# Patient Record
Sex: Female | Born: 1995 | Race: Black or African American | Hispanic: No | Marital: Married | Smoking: Current some day smoker
Health system: Southern US, Community
[De-identification: ages and names within clinical notes are randomized; demographics above are authoritative.]

## PROBLEM LIST (undated history)

## (undated) DIAGNOSIS — Z789 Other specified health status: Secondary | ICD-10-CM

## (undated) DIAGNOSIS — F419 Anxiety disorder, unspecified: Secondary | ICD-10-CM

## (undated) DIAGNOSIS — F32A Depression, unspecified: Secondary | ICD-10-CM

## (undated) DIAGNOSIS — F4310 Post-traumatic stress disorder, unspecified: Secondary | ICD-10-CM

## (undated) HISTORY — DX: Post-traumatic stress disorder, unspecified: F43.10

## (undated) HISTORY — DX: Anxiety disorder, unspecified: F41.9

## (undated) HISTORY — PX: NO PAST SURGERIES: SHX2092

## (undated) HISTORY — DX: Depression, unspecified: F32.A

---
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
## 2013-02-19 ENCOUNTER — Encounter (HOSPITAL_COMMUNITY): Payer: Self-pay | Admitting: Emergency Medicine

## 2013-02-19 ENCOUNTER — Emergency Department (HOSPITAL_COMMUNITY)
Admission: EM | Admit: 2013-02-19 | Discharge: 2013-02-19 | Disposition: A | Discharge status: Home / Self Care | Payer: Medicaid Other | Attending: Emergency Medicine | Admitting: Emergency Medicine

## 2013-02-19 DIAGNOSIS — N949 Unspecified condition associated with female genital organs and menstrual cycle: Secondary | ICD-10-CM | POA: Insufficient documentation

## 2013-02-19 DIAGNOSIS — N925 Other specified irregular menstruation: Secondary | ICD-10-CM | POA: Insufficient documentation

## 2013-02-19 DIAGNOSIS — F17200 Nicotine dependence, unspecified, uncomplicated: Secondary | ICD-10-CM | POA: Insufficient documentation

## 2013-02-19 DIAGNOSIS — N939 Abnormal uterine and vaginal bleeding, unspecified: Secondary | ICD-10-CM

## 2013-02-19 DIAGNOSIS — Z3202 Encounter for pregnancy test, result negative: Secondary | ICD-10-CM | POA: Insufficient documentation

## 2013-02-19 DIAGNOSIS — N938 Other specified abnormal uterine and vaginal bleeding: Secondary | ICD-10-CM | POA: Insufficient documentation

## 2013-02-19 LAB — URINE MICROSCOPIC-ADD ON

## 2013-02-19 LAB — WET PREP, GENITAL
CLUE CELLS WET PREP: NONE SEEN
Trich, Wet Prep: NONE SEEN
WBC, Wet Prep HPF POC: NONE SEEN
YEAST WET PREP: NONE SEEN

## 2013-02-19 LAB — URINALYSIS, ROUTINE W REFLEX MICROSCOPIC
BILIRUBIN URINE: NEGATIVE
GLUCOSE, UA: NEGATIVE mg/dL
Ketones, ur: NEGATIVE mg/dL
LEUKOCYTES UA: NEGATIVE
NITRITE: NEGATIVE
PH: 6.5 (ref 5.0–8.0)
Protein, ur: NEGATIVE mg/dL
SPECIFIC GRAVITY, URINE: 1.022 (ref 1.005–1.030)
Urobilinogen, UA: 0.2 mg/dL (ref 0.0–1.0)

## 2013-02-19 LAB — PREGNANCY, URINE: Preg Test, Ur: NEGATIVE

## 2013-02-19 LAB — POCT I-STAT, CHEM 8
BUN: 7 mg/dL (ref 6–23)
CHLORIDE: 103 meq/L (ref 96–112)
CREATININE: 0.7 mg/dL (ref 0.50–1.10)
Calcium, Ion: 1.23 mmol/L (ref 1.12–1.23)
Glucose, Bld: 101 mg/dL — ABNORMAL HIGH (ref 70–99)
HCT: 41 % (ref 36.0–46.0)
Hemoglobin: 13.9 g/dL (ref 12.0–15.0)
Potassium: 4 mEq/L (ref 3.7–5.3)
SODIUM: 140 meq/L (ref 137–147)
TCO2: 25 mmol/L (ref 0–100)

## 2013-02-19 NOTE — ED Notes (Signed)
Pt c/o no menstral cycle x324months, started yesterday

## 2013-02-19 NOTE — ED Provider Notes (Signed)
CSN: [id number]     Arrival date & time 02/19/13  1044 History   First MD Initiated Contact with Patient 02/19/13 1131     Chief Complaint  Patient presents with  . Dysmenorrhea   (Consider location/radiation/quality/duration/timing/severity/associated sxs/prior Treatment) HPI  Patient to the ER for evaluation of not having a menstrual cycle for 4 months and then vaginal bleeding started today. She says that she has not started any new medications or taking any hormone therapy.  She has had 3 negative pregnancy tests at home and would like a blood test done today in the ER. She denies having clots or significant pain with her menstrual cycle. She denies feeling weak, light headed, dizzy, confused or having syncope. She informs me that she will not take any medication that have a chemical base and only homeopathic medications  Past Medical History  Diagnosis Date  . No pertinent past medical history    Past Surgical History  Procedure Laterality Date  . No past surgeries     Family History  Problem Relation Age of Onset  . Other Neg Hx    History  Substance Use Topics  . Smoking status: Current Some Day Smoker  . Smokeless tobacco: Not on file  . Alcohol Use: No   OB History   Grav Para Term Preterm Abortions TAB SAB Ect Mult Living   0              Review of Systems The patient denies anorexia, fever, weight loss,, vision loss, decreased hearing, hoarseness, chest pain, syncope, dyspnea on exertion, peripheral edema, balance deficits, hemoptysis, abdominal pain, melena, hematochezia, severe indigestion/heartburn, hematuria, incontinence, genital sores, muscle weakness, suspicious skin lesions, transient blindness, difficulty walking, depression, unusual weight change, abnormal bleeding, enlarged lymph nodes, angioedema, and breast masses.  Allergies  Review of patient's allergies indicates no known allergies.  Home Medications  No current outpatient prescriptions on file. BP  121/83  Pulse 78  Temp(Src) 97.6 F (36.4 C) (Oral)  Resp 16  SpO2 100% Physical Exam  Nursing note and vitals reviewed. Constitutional: She appears well-developed and well-nourished. No distress.  HENT:  Head: Normocephalic and atraumatic.  Eyes: Pupils are equal, round, and reactive to light.  Neck: Normal range of motion. Neck supple.  Cardiovascular: Normal rate and regular rhythm.   Pulmonary/Chest: Effort normal.  Abdominal: Soft.  Genitourinary: Uterus normal. Cervix exhibits no motion tenderness and no discharge. Right adnexum displays no mass and no tenderness. Left adnexum displays no mass and no tenderness. There is bleeding around the vagina.  Neurological: She is alert.  Skin: Skin is warm and dry.    ED Course  Procedures (including critical care time) Labs Review Labs Reviewed  URINALYSIS, ROUTINE W REFLEX MICROSCOPIC - Abnormal; Notable for the following:    Hgb urine dipstick LARGE (*)    All other components within normal limits  URINE MICROSCOPIC-ADD ON - Abnormal; Notable for the following:    Bacteria, UA FEW (*)    All other components within normal limits  POCT I-STAT, CHEM 8 - Abnormal; Notable for the following:    Glucose, Bld 101 (*)    All other components within normal limits  WET PREP, GENITAL  GC/CHLAMYDIA PROBE AMP  PREGNANCY, URINE   Imaging Review No results found.  EKG Interpretation   None       MDM   1. Abnormal vaginal bleeding    Patient has a negative urine pregnancy test. Her i-stat chem 8 is negative for  low hemoglobin. Urine is WNL, aside from hemoglobin.  Wet prep shows no infection  Patient can have blood test done by a gynecologist if she prefers blood test over dipsticks we have here. I believe patient is having her normal menstrual cycle but can see ob.gyn about regulating her cycle.  18 y.o.Crestina A Naramore's evaluation in the Emergency Department is complete. It has been determined that no acute conditions  requiring further emergency intervention are present at this time. The patient/guardian have been advised of the diagnosis and plan. We have discussed signs and symptoms that warrant return to the ED, such as changes or worsening in symptoms.  Vital signs are stable at discharge. Filed Vitals:   02/19/13 1146  BP: 121/83  Pulse: 78  Temp: 97.6 F (36.4 C)  Resp: 16    Patient/guardian has voiced understanding and agreed to follow-up with the PCP or specialist.     Dorthula Matas, PA-C 02/19/13 1333

## 2013-02-19 NOTE — Discharge Instructions (Signed)

## 2013-02-19 NOTE — Progress Notes (Signed)
[facility] provided pt with a list of self-pay doctors, highlighting [facility]. Also, provided pt with [facility] information.

## 2013-02-20 LAB — GC/CHLAMYDIA PROBE AMP
CT PROBE, AMP APTIMA: NEGATIVE
GC PROBE AMP APTIMA: NEGATIVE

## 2013-02-20 NOTE — ED Provider Notes (Signed)
Medical screening examination/treatment/procedure(s) were performed by non-physician practitioner and as supervising physician I was immediately available for consultation/collaboration.  EKG Interpretation   None         Evalynne Locurto T Gladis Soley, MD 02/20/13 0740 

## 2014-11-29 ENCOUNTER — Ambulatory Visit: Payer: Self-pay

## 2014-11-29 ENCOUNTER — Other Ambulatory Visit: Payer: Self-pay | Admitting: Occupational Medicine

## 2014-11-29 DIAGNOSIS — M79645 Pain in left finger(s): Secondary | ICD-10-CM

## 2015-02-16 NOTE — L&D Delivery Note (Signed)
Delivery Note At 0353 a viable female, "Amanda Sloan", was delivered via Vaginal, Spontaneous Delivery (Presentation: OA restituting to ROA). APGARS: 9, 9; weight 6 lbs 11.2 oz (3039 g).   Placenta status: Spontaneous, intact. Cord: WNL, with the following complications: None. Cord pH: NA  Anesthesia: None Episiotomy: None Lacerations: Right inner labia - hemostatic; no repair required Suture Repair: NA Est. Blood Loss (mL): 250   Mom to postpartum. Baby to Couplet care / Skin to Skin. Birth control not discussed. Breastfeeding. Fundus firm, scant to small bleeding - IM Pitocin not given per pt's request. No IV on admission also per pt's request. Will monitor bleeding closely and give uterotonic(s) as indicated.   Sherre ScarletKimberly Lianne Carreto, CNM 11/17/15, 4:42 AM

## 2015-06-12 LAB — OB RESULTS CONSOLE ANTIBODY SCREEN: ANTIBODY SCREEN: NEGATIVE

## 2015-06-12 LAB — OB RESULTS CONSOLE RUBELLA ANTIBODY, IGM: RUBELLA: IMMUNE

## 2015-06-12 LAB — OB RESULTS CONSOLE ABO/RH: RH Type: NEGATIVE

## 2015-06-12 LAB — OB RESULTS CONSOLE HIV ANTIBODY (ROUTINE TESTING): HIV: NONREACTIVE

## 2015-06-12 LAB — OB RESULTS CONSOLE HEPATITIS B SURFACE ANTIGEN: Hepatitis B Surface Ag: NEGATIVE

## 2015-06-12 LAB — OB RESULTS CONSOLE RPR: RPR: NONREACTIVE

## 2015-10-30 LAB — OB RESULTS CONSOLE GBS: GBS: NEGATIVE

## 2015-10-30 LAB — OB RESULTS CONSOLE GC/CHLAMYDIA
CHLAMYDIA, DNA PROBE: NEGATIVE
GC PROBE AMP, GENITAL: NEGATIVE

## 2015-11-16 ENCOUNTER — Encounter (HOSPITAL_COMMUNITY): Payer: Self-pay | Admitting: *Deleted

## 2015-11-16 ENCOUNTER — Inpatient Hospital Stay (HOSPITAL_COMMUNITY)
Admission: AD | Admit: 2015-11-16 | Discharge: 2015-11-16 | Disposition: A | Discharge status: Home / Self Care | Payer: Medicaid Other | Source: Home / Self Care | Attending: Obstetrics & Gynecology | Admitting: Obstetrics & Gynecology

## 2015-11-16 NOTE — MAU Note (Signed)
Contractions for 24hrs. No bleeding. ? Leaking fld but pt is unsure.

## 2015-11-16 NOTE — Discharge Instructions (Signed)
Braxton Hicks Contractions °Contractions of the uterus can occur throughout pregnancy. Contractions are not always a sign that you are in labor.  °WHAT ARE BRAXTON HICKS CONTRACTIONS?  °Contractions that occur before labor are called Braxton Hicks contractions, or false labor. Toward the end of pregnancy (32-34 weeks), these contractions can develop more often and may become more forceful. This is not true labor because these contractions do not result in opening (dilatation) and thinning of the cervix. They are sometimes difficult to tell apart from true labor because these contractions can be forceful and people have different pain tolerances. You should not feel embarrassed if you go to the hospital with false labor. Sometimes, the only way to tell if you are in true labor is for your health care provider to look for changes in the cervix. °If there are no prenatal problems or other health problems associated with the pregnancy, it is completely safe to be sent home with false labor and await the onset of true labor. °HOW CAN YOU TELL THE DIFFERENCE BETWEEN TRUE AND FALSE LABOR? °False Labor °· The contractions of false labor are usually shorter and not as hard as those of true labor.   °· The contractions are usually irregular.   °· The contractions are often felt in the front of the lower abdomen and in the groin.   °· The contractions may go away when you walk around or change positions while lying down.   °· The contractions get weaker and are shorter lasting as time goes on.   °· The contractions do not usually become progressively stronger, regular, and closer together as with true labor.   °True Labor °· Contractions in true labor last 30-70 seconds, become very regular, usually become more intense, and increase in frequency.   °· The contractions do not go away with walking.   °· The discomfort is usually felt in the top of the uterus and spreads to the lower abdomen and low back.   °· True labor can be  determined by your health care provider with an exam. This will show that the cervix is dilating and getting thinner.   °WHAT TO REMEMBER °· Keep up with your usual exercises and follow other instructions given by your health care provider.   °· Take medicines as directed by your health care provider.   °· Keep your regular prenatal appointments.   °· Eat and drink lightly if you think you are going into labor.   °· If Braxton Hicks contractions are making you uncomfortable:   °¨ Change your position from lying down or resting to walking, or from walking to resting.   °¨ Sit and rest in a tub of warm water.   °¨ Drink 2-3 glasses of water. Dehydration may cause these contractions.   °¨ Do slow and deep breathing several times an hour.   °WHEN SHOULD I SEEK IMMEDIATE MEDICAL CARE? °Seek immediate medical care if: °· Your contractions become stronger, more regular, and closer together.   °· You have fluid leaking or gushing from your vagina.   °· You have a fever.   °· You pass blood-tinged mucus.   °· You have vaginal bleeding.   °· You have continuous abdominal pain.   °· You have low back pain that you never had before.   °· You feel your baby's head pushing down and causing pelvic pressure.   °· Your baby is not moving as much as it used to.   °  °This information is not intended to replace advice given to you by your health care provider. Make sure you discuss any questions you have with your health care   provider. °  °Document Released: 02/01/2005 Document Revised: 02/06/2013 Document Reviewed: 11/13/2012 °Elsevier Interactive Patient Education ©2016 [employer] ° °

## 2015-11-17 ENCOUNTER — Inpatient Hospital Stay (HOSPITAL_COMMUNITY)
Admission: AD | Admit: 2015-11-17 | Discharge: 2015-11-19 | DRG: 775 | Disposition: A | Discharge status: Home / Self Care | Payer: Medicaid Other | Source: Ambulatory Visit | Attending: Obstetrics & Gynecology | Admitting: Obstetrics & Gynecology

## 2015-11-17 ENCOUNTER — Encounter (HOSPITAL_COMMUNITY): Payer: Self-pay

## 2015-11-17 DIAGNOSIS — Z8279 Family history of other congenital malformations, deformations and chromosomal abnormalities: Secondary | ICD-10-CM

## 2015-11-17 DIAGNOSIS — O26893 Other specified pregnancy related conditions, third trimester: Secondary | ICD-10-CM | POA: Diagnosis present

## 2015-11-17 DIAGNOSIS — O0930 Supervision of pregnancy with insufficient antenatal care, unspecified trimester: Secondary | ICD-10-CM

## 2015-11-17 DIAGNOSIS — Z3A39 39 weeks gestation of pregnancy: Secondary | ICD-10-CM

## 2015-11-17 DIAGNOSIS — O26899 Other specified pregnancy related conditions, unspecified trimester: Secondary | ICD-10-CM

## 2015-11-17 DIAGNOSIS — O9902 Anemia complicating childbirth: Secondary | ICD-10-CM | POA: Diagnosis present

## 2015-11-17 DIAGNOSIS — D649 Anemia, unspecified: Secondary | ICD-10-CM | POA: Diagnosis present

## 2015-11-17 DIAGNOSIS — Z832 Family history of diseases of the blood and blood-forming organs and certain disorders involving the immune mechanism: Secondary | ICD-10-CM

## 2015-11-17 DIAGNOSIS — Z6791 Unspecified blood type, Rh negative: Secondary | ICD-10-CM | POA: Diagnosis not present

## 2015-11-17 DIAGNOSIS — Z3483 Encounter for supervision of other normal pregnancy, third trimester: Secondary | ICD-10-CM | POA: Diagnosis present

## 2015-11-17 LAB — ABO/RH: ABO/RH(D): O NEG

## 2015-11-17 LAB — CBC
HCT: 28.5 % — ABNORMAL LOW (ref 36.0–46.0)
Hemoglobin: 9.4 g/dL — ABNORMAL LOW (ref 12.0–15.0)
MCH: 29.3 pg (ref 26.0–34.0)
MCHC: 33 g/dL (ref 30.0–36.0)
MCV: 88.8 fL (ref 78.0–100.0)
PLATELETS: 159 10*3/uL (ref 150–400)
RBC: 3.21 MIL/uL — AB (ref 3.87–5.11)
RDW: 14.1 % (ref 11.5–15.5)
WBC: 12.8 10*3/uL — ABNORMAL HIGH (ref 4.0–10.5)

## 2015-11-17 LAB — TYPE AND SCREEN
ABO/RH(D): O NEG
Antibody Screen: NEGATIVE

## 2015-11-17 MED ORDER — OXYCODONE-ACETAMINOPHEN 5-325 MG PO TABS: 1.0000 | ORAL_TABLET | ORAL | Status: DC | PRN

## 2015-11-17 MED ORDER — COCONUT OIL OIL
1.0000 "application " | TOPICAL_OIL | Status: DC | PRN
  Administered 2015-11-18: 1 via TOPICAL
  Filled 2015-11-17: qty 120

## 2015-11-17 MED ORDER — SENNOSIDES-DOCUSATE SODIUM 8.6-50 MG PO TABS
2.0000 | ORAL_TABLET | ORAL | Status: DC
  Filled 2015-11-17 (×2): qty 2

## 2015-11-17 MED ORDER — ONDANSETRON HCL 4 MG/2ML IJ SOLN: 4.0000 mg | INTRAMUSCULAR | Status: DC | PRN

## 2015-11-17 MED ORDER — LACTATED RINGERS IV SOLN: INTRAVENOUS | Status: DC

## 2015-11-17 MED ORDER — SIMETHICONE 80 MG PO CHEW: 80.0000 mg | CHEWABLE_TABLET | ORAL | Status: DC | PRN

## 2015-11-17 MED ORDER — OXYCODONE-ACETAMINOPHEN 5-325 MG PO TABS: 2.0000 | ORAL_TABLET | ORAL | Status: DC | PRN

## 2015-11-17 MED ORDER — ACETAMINOPHEN 325 MG PO TABS: 650.0000 mg | ORAL_TABLET | ORAL | Status: DC | PRN

## 2015-11-17 MED ORDER — DIBUCAINE 1 % RE OINT: 1.0000 "application " | TOPICAL_OINTMENT | RECTAL | Status: DC | PRN

## 2015-11-17 MED ORDER — ZOLPIDEM TARTRATE 5 MG PO TABS: 5.0000 mg | ORAL_TABLET | Freq: Every evening | ORAL | Status: DC | PRN

## 2015-11-17 MED ORDER — PRENATAL MULTIVITAMIN CH
1.0000 | ORAL_TABLET | Freq: Every day | ORAL | Status: DC
  Administered 2015-11-17 – 2015-11-18 (×2): 1 via ORAL
  Filled 2015-11-17 (×2): qty 1

## 2015-11-17 MED ORDER — FLEET ENEMA 7-19 GM/118ML RE ENEM: 1.0000 | ENEMA | RECTAL | Status: DC | PRN

## 2015-11-17 MED ORDER — LIDOCAINE HCL (PF) 1 % IJ SOLN
30.0000 mL | INTRAMUSCULAR | Status: DC | PRN
  Filled 2015-11-17: qty 30

## 2015-11-17 MED ORDER — LIDOCAINE HCL (PF) 1 % IJ SOLN
INTRAMUSCULAR | Status: AC
  Filled 2015-11-17: qty 30

## 2015-11-17 MED ORDER — WITCH HAZEL-GLYCERIN EX PADS: 1.0000 "application " | MEDICATED_PAD | CUTANEOUS | Status: DC | PRN

## 2015-11-17 MED ORDER — DOCUSATE SODIUM 100 MG PO CAPS
100.0000 mg | ORAL_CAPSULE | Freq: Two times a day (BID) | ORAL | Status: DC
  Administered 2015-11-18: 100 mg via ORAL
  Filled 2015-11-17 (×4): qty 1

## 2015-11-17 MED ORDER — IBUPROFEN 600 MG PO TABS
600.0000 mg | ORAL_TABLET | Freq: Four times a day (QID) | ORAL | Status: DC
  Filled 2015-11-17 (×7): qty 1

## 2015-11-17 MED ORDER — SOD CITRATE-CITRIC ACID 500-334 MG/5ML PO SOLN: 30.0000 mL | ORAL | Status: DC | PRN

## 2015-11-17 MED ORDER — LACTATED RINGERS IV SOLN: 500.0000 mL | INTRAVENOUS | Status: DC | PRN

## 2015-11-17 MED ORDER — TETANUS-DIPHTH-ACELL PERTUSSIS 5-2.5-18.5 LF-MCG/0.5 IM SUSP: 0.5000 mL | Freq: Once | INTRAMUSCULAR | Status: DC

## 2015-11-17 MED ORDER — OXYTOCIN 10 UNIT/ML IJ SOLN
INTRAMUSCULAR | Status: AC
  Administered 2015-11-17: 20 [IU]
  Filled 2015-11-17: qty 2

## 2015-11-17 MED ORDER — DIPHENHYDRAMINE HCL 25 MG PO CAPS: 25.0000 mg | ORAL_CAPSULE | Freq: Four times a day (QID) | ORAL | Status: DC | PRN

## 2015-11-17 MED ORDER — OXYTOCIN 40 UNITS IN LACTATED RINGERS INFUSION - SIMPLE MED: 2.5000 [IU]/h | INTRAVENOUS | Status: DC

## 2015-11-17 MED ORDER — OXYTOCIN BOLUS FROM INFUSION: 500.0000 mL | Freq: Once | INTRAVENOUS | Status: DC

## 2015-11-17 MED ORDER — BENZOCAINE-MENTHOL 20-0.5 % EX AERO: 1.0000 "application " | INHALATION_SPRAY | CUTANEOUS | Status: DC | PRN

## 2015-11-17 MED ORDER — ONDANSETRON HCL 4 MG PO TABS: 4.0000 mg | ORAL_TABLET | ORAL | Status: DC | PRN

## 2015-11-17 MED ORDER — ONDANSETRON HCL 4 MG/2ML IJ SOLN: 4.0000 mg | Freq: Four times a day (QID) | INTRAMUSCULAR | Status: DC | PRN

## 2015-11-17 NOTE — Progress Notes (Signed)
Called to notify of pt arrival in MAU and cervical exam. RN answered phone and states CNM is in delivery. Report given to RN regarding pt discomfort and inability to monitor baby due to patient refusal. RN states the CNM is unable to take report and will call back when she can.

## 2015-11-17 NOTE — Lactation Note (Signed)
This note was copied from a baby's chart. Lactation Consultation Note  Patient Name: Amanda Sloan Today's Date: 11/17/2015 Reason for consult: Initial assessment Breastfeeding consultation services and support information given and reviewed.  This is mom's first baby.  Baby is 6 hours old and has been to the breast a few times.  Mom states she has a more difficult latch on her right side.  Assisted mom with positioning baby on right using football hold. Mom demonstrated hand expression and obtained a few drops of colostrum.  Baby latches easily and suckled off and on for 10 minutes then fell asleep.  Instructed to latch with any feeding cue and to call for assist prn.  FOB present and supportive.  Maternal Data Has patient been taught Hand Expression?: Yes Does the patient have breastfeeding experience prior to this delivery?: No  Feeding Feeding Type: Breast Fed Length of feed: 10 min  LATCH Score/Interventions Latch: Grasps breast easily, tongue down, lips flanged, rhythmical sucking.  Audible Swallowing: A few with stimulation Intervention(s): Skin to skin;Hand expression;Alternate breast massage  Type of Nipple: Everted at rest and after stimulation  Comfort (Breast/Nipple): Soft / non-tender     Hold (Positioning): Assistance needed to correctly position infant at breast and maintain latch. Intervention(s): Breastfeeding basics reviewed;Support Pillows;Position options;Skin to skin  LATCH Score: 8  Lactation Tools Discussed/Used     Consult Status Consult Status: Follow-up Date: 11/18/15 Follow-up type: In-patient    Huston FoleyMOULDEN, Dyon Rotert S 11/17/2015, 10:45 AM

## 2015-11-17 NOTE — Progress Notes (Signed)
Message left requesting call back for report on patient in MAU and patient pain increasing.

## 2015-11-17 NOTE — MAU Note (Signed)
Pt presents complaining of contractions. Denies leaking or bleeding. Reports good fetal movement 

## 2015-11-17 NOTE — MAU Note (Signed)
Pt refusing continuous monitoring. States she needs to stand and it is too uncomfortable

## 2015-11-17 NOTE — H&P (Signed)
Amanda Sloan is a 20 y.o. female presenting for active labor. +FM. Denies VB or LOF.  OB History    Gravida Para Term Preterm AB Living   1 1 1     1    SAB TAB Ectopic Multiple Live Births         0 1     Past Medical History:  Diagnosis Date  . No pertinent past medical history    Past Surgical History:  Procedure Laterality Date  . NO PAST SURGERIES     Family History: family history is not on file. Social History:  reports that she has been smoking.  She has never used smokeless tobacco. She reports that she uses drugs, including Marijuana. She reports that she does not drink alcohol.     Maternal Diabetes: No Genetic Screening: Normal Maternal Ultrasounds/Referrals: Normal Fetal Ultrasounds or other Referrals:  None Maternal Substance Abuse:  Yes:  Type: Marijuana Significant Maternal Medications:  Meds include: Other: PNV Significant Maternal Lab Results:  Lab values include: Group B Strep negative, Rh negative Other Comments:  Hbg 11.0 at 28 wks. Declined Tdap. Refused Rhophylac at 28 wks.  ROS 10 Systems reviewed and are negative for acute change except as noted in the HPI.  History Exam  Dilation: 10 Effacement (%): 100 Station: +2 Exam by:: Amanda Sloan CNM BBOW - declines AROM Blood pressure 130/75, pulse 64, temperature 98.4 F (36.9 C), temperature source Oral, resp. rate 18, last menstrual period 11/28/2014, SpO2 99 %, unknown if currently breastfeeding.  Physical Exam  Lungs CTA CV RRR ABd gravid, NT Ext no calf tenderness FHT cat 1 Toco q2-543min  Prenatal labs: ABO, Rh: --/--/O NEG (10/02 0626) Antibody: NEG (10/02 [id number]) Rubella: Immune (04/27 0000) RPR: Nonreactive (04/27 0000)  HBsAg: Negative (04/27 0000)  HIV: Non-reactive (04/27 0000)  GBS: Negative (09/14 0000)   Assessment/Plan: P0 at 39 1/7wks presenting in active labor.  Fetal status is reassuring with cat 1 tracing. Rh neg (refused Rhophylac at 28 wks). Expect SVD briefly.     Amanda ScarletWILLIAMS, Magnum Sloan 11/17/2015, 3:42 AM

## 2015-11-18 LAB — RPR: RPR Ser Ql: NONREACTIVE

## 2015-11-18 LAB — CBC
HCT: 29.6 % — ABNORMAL LOW (ref 36.0–46.0)
Hemoglobin: 9.7 g/dL — ABNORMAL LOW (ref 12.0–15.0)
MCH: 29.6 pg (ref 26.0–34.0)
MCHC: 32.8 g/dL (ref 30.0–36.0)
MCV: 90.2 fL (ref 78.0–100.0)
PLATELETS: 147 10*3/uL — AB (ref 150–400)
RBC: 3.28 MIL/uL — AB (ref 3.87–5.11)
RDW: 14.5 % (ref 11.5–15.5)
WBC: 8 10*3/uL (ref 4.0–10.5)

## 2015-11-18 MED ORDER — RHO D IMMUNE GLOBULIN 1500 UNIT/2ML IJ SOSY
300.0000 ug | PREFILLED_SYRINGE | Freq: Once | INTRAMUSCULAR | Status: AC
  Administered 2015-11-18: 300 ug via INTRAMUSCULAR
  Filled 2015-11-18: qty 2

## 2015-11-18 NOTE — Progress Notes (Signed)
Post Partum Day 1 Subjective:  Well. Lochia are normal. Voiding, ambulating, tolerating normal diet. nursing going well.  Objective: Blood pressure 137/72, pulse 74, temperature 98 F (36.7 C), resp. rate 18, last menstrual period 11/28/2014, SpO2 99 %, unknown if currently breastfeeding.  Physical Exam:  General: normal Lochia: appropriate Uterine Fundus: 0/1 firm non-tender  Extremities: No evidence of DVT seen on physical exam. Edema minimal     Recent Labs  11/17/15 0618 11/18/15 0552  HGB 9.4* 9.7*  HCT 28.5* 29.6*    Assessment/Plan: Normal Post-partum. Continue routine post-partum care. Anticipate discharge tomorrow    LOS: 1 day   Jenice Leiner A MD 11/18/2015, 9:28 AM

## 2015-11-18 NOTE — Lactation Note (Signed)
This note was copied from a baby's chart. Lactation Consultation Note Follow up visit at 40 hours of age.  Mom requests LC observe latch.  Mom reports soreness on left nipple that is semi flat with short shaft.  Mom has compressible tissue and baby can latch with mom compressing breast.  LC encouraged mom to hold breast during feeding and allow baby a close deep latch.  Mom needs reminder to keep baby closely latched and then audible gulping is noted.  FOB at bedside supportive.  LC observed feeding and then attempt on left breast. LC instructed mom on making sure lower lip has wide gape to accommodate breast for a deeper latch.  Mom reports improved comfort with latch although baby not eager to continue feeding on left breast.  LC encouraged mom to continue to hand express and apply coconut oil.  Nipples are intact at this time.    Patient Name: Amanda Sloan ZOXWR'UToday's Date: 11/18/2015 Reason for consult: Follow-up assessment;Difficult latch   Maternal Data    Feeding Feeding Type: Breast Fed Length of feed: 15 min  LATCH Score/Interventions Latch: Grasps breast easily, tongue down, lips flanged, rhythmical sucking.  Audible Swallowing: Spontaneous and intermittent Intervention(s): Hand expression;Alternate breast massage  Type of Nipple: Flat Intervention(s): No intervention needed  Comfort (Breast/Nipple): Filling, red/small blisters or bruises, mild/mod discomfort  Problem noted: Mild/Moderate discomfort  Hold (Positioning): Assistance needed to correctly position infant at breast and maintain latch. Intervention(s): Breastfeeding basics reviewed;Support Pillows;Position options;Skin to skin  LATCH Score: 7  Lactation Tools Discussed/Used     Consult Status Consult Status: Follow-up Date: 11/19/15 Follow-up type: In-patient    Jannifer RodneyShoptaw, Jana Lynn 11/18/2015, 8:09 PM

## 2015-11-18 NOTE — Lactation Note (Signed)
This note was copied from a baby's chart. Lactation Consultation Note Mom and baby resting.  Patient Name: Amanda Sloan Today's Date: 11/18/2015     Maternal Data    Feeding Feeding Type: Breast Fed Length of feed: 22 min  LATCH Score/Interventions Latch: Grasps breast easily, tongue down, lips flanged, rhythmical sucking.  Audible Swallowing: A few with stimulation Intervention(s): Skin to skin;Hand expression;Alternate breast massage  Type of Nipple: Everted at rest and after stimulation  Comfort (Breast/Nipple): Soft / non-tender     Hold (Positioning): No assistance needed to correctly position infant at breast. Intervention(s): Breastfeeding basics reviewed;Skin to skin;Position options;Support Pillows  LATCH Score: 9  Lactation Tools Discussed/Used     Consult Status      Cornel Werber, Arvella MerlesJana Lynn 11/18/2015, 6:59 PM

## 2015-11-19 LAB — RH IG WORKUP (INCLUDES ABO/RH)
ABO/RH(D): O NEG
Fetal Screen: NEGATIVE
Gestational Age(Wks): 39.1
Unit division: 0

## 2015-11-19 MED ORDER — IBUPROFEN 600 MG PO TABS: 600.0000 mg | ORAL_TABLET | Freq: Four times a day (QID) | ORAL | 2 refills | Status: DC | PRN

## 2015-11-19 MED ORDER — FERROUS SULFATE 325 (65 FE) MG PO TABS: 325.0000 mg | ORAL_TABLET | Freq: Two times a day (BID) | ORAL | 3 refills | Status: DC

## 2015-11-19 NOTE — Discharge Summary (Signed)
[facility] Discharge Summary   Patient Name:   Amanda Sloan DOB:     09/25/1995 MRN:     [id number]  Date of Admission:   11/17/2015 Date of Discharge:  11/20/2015  Admitting diagnosis:    39 WEEKS PAIN CTX Principal Problem:   Vaginal delivery Active Problems:   Rh negative, maternal   Late prenatal care   Family history of sickle cell anemia   Family history of cleft lip  Term Pregnancy Delivered and Anemia    Discharge diagnosis:    6239 WEEKS PAIN CTX Principal Problem:   Vaginal delivery Active Problems:   Rh negative, maternal   Late prenatal care   Family history of sickle cell anemia   Family history of cleft lip  Term Pregnancy Delivered and Anemia                                                                     Post partum procedures: rhogam  Type of Delivery:  SVB  Delivering Provider: Sherre ScarletWILLIAMS, Elisia Stepp   Date of Delivery:  11/17/15  Newborn Data:    Live born female  Birth Weight: 6 lb 11.2 oz (3039 g) APGARS: 9, 9  Baby's Name:              Jackelyn KnifeMary Baby Feeding:   Breast Disposition:   home with mother  Complications:   None  Hospital course:      Onset of Labor With Vaginal Delivery     20 y.o. yo G1P1001 at 1075w1d was admitted in Active Labor on 11/17/2015. Patient had an uncomplicated labor course as follows:  Membrane Rupture Time/Date: 3:50 AM ,11/17/2015   Intrapartum Procedures: Episiotomy: None [1]                                         Lacerations:  None [1]  Patient had a delivery of a Viable infant. 11/17/2015  Information for the patient's newborn:  Eulah PontMurphy, Girl Jamiracle [[id number]][[id number]]  Delivery Method: Vaginal, Spontaneous Delivery (Filed from Delivery Summary)    Pateint had an uncomplicated postpartum course.  She is ambulating, tolerating a regular diet, passing flatus, and urinating well. Patient is discharged home in stable condition on 11/20/15.    Physical Exam:   Vitals:   11/17/15 2040 11/18/15 0510  11/18/15 1748 11/19/15 0530  BP: 124/78 137/72 114/72 123/77  Pulse: 75 74 85 67  Resp: 18 18 18 18   Temp: 98.4 F (36.9 C) 98 F (36.7 C) 98.4 F (36.9 C) 97.6 F (36.4 C)  TempSrc: Oral  Oral   SpO2:       General: alert, cooperative and no distress Lochia: appropriate Uterine Fundus: firm Incision: N/A DVT Evaluation: No evidence of DVT seen on physical exam. Negative Homan's sign. No cords or calf tenderness.  Labs: Lab Results  Component Value Date   WBC 8.0 11/18/2015   HGB 9.7 (L) 11/18/2015   HCT 29.6 (L) 11/18/2015   MCV 90.2 11/18/2015   PLT 147 (L) 11/18/2015   CMP Latest Ref Rng & Units 02/19/2013  Glucose 70 - 99 mg/dL 782(N101(H)  BUN 6 - 23 mg/dL  7  Creatinine 0.50 - 1.10 mg/dL 9.14  Sodium 782 - 956 mEq/L 140  Potassium 3.7 - 5.3 mEq/L 4.0  Chloride 96 - 112 mEq/L 103    Discharge instruction: per After Visit Summary and "Baby and Me Booklet".  After Visit Meds:    Medication List    TAKE these medications   ferrous sulfate 325 (65 FE) MG tablet Take 1 tablet (325 mg total) by mouth 2 (two) times daily with a meal.   ibuprofen 600 MG tablet Commonly known as:  ADVIL,MOTRIN Take 1 tablet (600 mg total) by mouth every 6 (six) hours as needed for fever, headache, mild pain, moderate pain or cramping.       Diet: routine diet  Activity: Advance as tolerated. Pelvic rest for 6 weeks.   Outpatient follow up:6 weeks  Postpartum contraception: Undecided  11/20/2015 Sherre Scarlet, CNM

## 2015-11-19 NOTE — Clinical Social Work Maternal (Signed)
  CLINICAL SOCIAL WORK MATERNAL/CHILD NOTE  Patient Details  Name: Amanda Sloan MRN: [id number] Date of Birth: August 18, 1995  Date:  11/19/2015  Clinical Social Worker Initiating Note:  Laurey Arrow Date/ Time Initiated:  11/19/15/1122     Child's Name:  Amanda Sloan   Legal Guardian:  Mother   Need for Interpreter:  None   Date of Referral:  11/19/15     Reason for Referral:  Current Substance Use/Substance Use During Pregnancy    Referral Source:      Address:  [address] [place] [address]  Phone number:  [phone]   Household Members:  Self, Spouse   Natural Supports (not living in the home):  Extended Family, Immediate Family, Spouse/significant other, Parent   Professional Supports: None   Employment:     Type of Work:     Education:   Engineer, site)   Pensions consultant:  [employer]   Other Resources:  [employer], Physicist, medical    Cultural/Religious Considerations Which May Impact Care:  None Reported  Strengths:  Ability to meet basic needs , Engineer, materials , Home prepared for child    Risk Factors/Current Problems:  Substance Use    Cognitive State:  Alert , Linear Thinking , Insightful    Mood/Affect:  Happy , Calm , Bright    CSW Assessment: CSW met with [name] to complete an assessment for a consult for hx of substance use. When CSW arrived, [name] sister Amanda Sloan) was visiting with [name].  [name] gave CSW permission to meet with [name] while [name]'s sister was present. [name] inquired about [name]'s hx, and [name] denied the use of any substance throughout [name]'s pregnancy.  [name] reported [name]'s last use of marijuana was July 2016. CSW thanked [name] for sharing [name]'s substance use information and informed [name] of the hospital's drug screen policy. CSW communicated to [name] that CSW will follow the infant's cord screen, and if the infant has a positive screen without explanation, CSW will make a report to [place].  [name] communicated [name] was not concerned,  and denied utilizing any substance during pregnancy.  CSW thanked [name] for meeting with CSW and provided [name] with CSW contact information for future questions or concerns. CSW Plan/Description:  Patient/Family Education , No Further Intervention Required/No Barriers to Discharge, Information/Referral to [employer]  (CSW will follow infant's cord and will make a report to [place] if warranted)   Laurey Arrow, MSW, LCSW Clinical Social Work [phone]   Dimple Nanas, LCSW 11/19/2015, 11:25 AM

## 2015-11-19 NOTE — Lactation Note (Signed)
This note was copied from a baby's chart. Lactation Consultation Note  Patient Name: Amanda Sloan Today's Date: 11/19/2015   Mom has no questions or concerns at this time.  Amanda Sloan, Amanda [facility]loan [facility] 11/19/2015, 10:50 AM

## 2015-11-19 NOTE — Progress Notes (Signed)
Amanda Sloan, Amanda Sloan Female, 20 y.o., 17-Feb-1995  Post Partum Day 2 Subjective: no complaints  Objective: Blood pressure 123/77, pulse 67, temperature 97.6 F (36.4 C), resp. rate 18, last menstrual period 11/28/2014, SpO2 99 %, unknown if currently breastfeeding.  Physical Exam:  General: alert, cooperative and no distress Lochia: appropriate Uterine Fundus: firm DVT Evaluation: No evidence of DVT seen on physical exam.   Recent Labs  11/17/15 0618 11/18/15 0552  HGB 9.4* 9.7*  HCT 28.5* 29.6*    Assessment/Plan: Postpartum day 2 doing well Discharge home, Breastfeeding and Contraception Undecided, all options discussed and she will thing about it, may use withdrawal.    LOS: 2 days   Konrad FelixKULWA,Alyssia Heese WAKURU , MD.  11/19/2015, 9:38 AM

## 2015-11-19 NOTE — Progress Notes (Signed)
Discharge instructions reviewed with patient.  Patient states understanding of home care for self and baby, medications, activity, signs/symptoms to report to MD and return MD office visits for both.  Patients significant other and family will assist with her care @ home.  No home  equipment needed, patient has prescriptions and all personal belongings.  Patient ambulated for discharge in stable condition with staff without incident.  

## 2015-11-19 NOTE — Discharge Instructions (Signed)
Contraception Choices Contraception (birth control) is the use of any methods or devices to prevent pregnancy. Below are some methods to help avoid pregnancy. HORMONAL METHODS   Contraceptive implant. This is a thin, plastic tube containing progesterone hormone. It does not contain estrogen hormone. Your health care provider inserts the tube in the inner part of the upper arm. The tube can remain in place for up to 3 years. After 3 years, the implant must be removed. The implant prevents the ovaries from releasing an egg (ovulation), thickens the cervical mucus to prevent sperm from entering the uterus, and thins the lining of the inside of the uterus.  Progesterone-only injections. These injections are given every 3 months by your health care provider to prevent pregnancy. This synthetic progesterone hormone stops the ovaries from releasing eggs. It also thickens cervical mucus and changes the uterine lining. This makes it harder for sperm to survive in the uterus.  Birth control pills. These pills contain estrogen and progesterone hormone. They work by preventing the ovaries from releasing eggs (ovulation). They also cause the cervical mucus to thicken, preventing the sperm from entering the uterus. Birth control pills are prescribed by a health care provider.Birth control pills can also be used to treat heavy periods.  Minipill. This type of birth control pill contains only the progesterone hormone. They are taken every day of each month and must be prescribed by your health care provider.  Birth control patch. The patch contains hormones similar to those in birth control pills. It must be changed once a week and is prescribed by a health care provider.  Vaginal ring. The ring contains hormones similar to those in birth control pills. It is left in the vagina for 3 weeks, removed for 1 week, and then a new one is put back in place. The patient must be comfortable inserting and removing the ring  from the vagina.A health care provider's prescription is necessary.  Emergency contraception. Emergency contraceptives prevent pregnancy after unprotected sexual intercourse. This pill can be taken right after sex or up to 5 days after unprotected sex. It is most effective the sooner you take the pills after having sexual intercourse. Most emergency contraceptive pills are available without a prescription. Check with your pharmacist. Do not use emergency contraception as your only form of birth control. BARRIER METHODS   Female condom. This is a thin sheath (latex or rubber) that is worn over the penis during sexual intercourse. It can be used with spermicide to increase effectiveness.  Female condom. This is a soft, loose-fitting sheath that is put into the vagina before sexual intercourse.  Diaphragm. This is a soft, latex, dome-shaped barrier that must be fitted by a health care provider. It is inserted into the vagina, along with a spermicidal jelly. It is inserted before intercourse. The diaphragm should be left in the vagina for 6 to 8 hours after intercourse.  Cervical cap. This is a round, soft, latex or plastic cup that fits over the cervix and must be fitted by a health care provider. The cap can be left in place for up to 48 hours after intercourse.  Sponge. This is a soft, circular piece of polyurethane foam. The sponge has spermicide in it. It is inserted into the vagina after wetting it and before sexual intercourse.  Spermicides. These are chemicals that kill or block sperm from entering the cervix and uterus. They come in the form of creams, jellies, suppositories, foam, or tablets. They do not require a  prescription. They are inserted into the vagina with an applicator before having sexual intercourse. The process must be repeated every time you have sexual intercourse. INTRAUTERINE CONTRACEPTION  Intrauterine device (IUD). This is a T-shaped device that is put in a woman's uterus  during a menstrual period to prevent pregnancy. There are 2 types:  Copper IUD. This type of IUD is wrapped in copper wire and is placed inside the uterus. Copper makes the uterus and fallopian tubes produce a fluid that kills sperm. It can stay in place for 10 years.  Hormone IUD. This type of IUD contains the hormone progestin (synthetic progesterone). The hormone thickens the cervical mucus and prevents sperm from entering the uterus, and it also thins the uterine lining to prevent implantation of a fertilized egg. The hormone can weaken or kill the sperm that get into the uterus. It can stay in place for 3-5 years, depending on which type of IUD is used. PERMANENT METHODS OF CONTRACEPTION  Female tubal ligation. This is when the woman's fallopian tubes are surgically sealed, tied, or blocked to prevent the egg from traveling to the uterus.  Hysteroscopic sterilization. This involves placing a small coil or insert into each fallopian tube. Your doctor uses a technique called hysteroscopy to do the procedure. The device causes scar tissue to form. This results in permanent blockage of the fallopian tubes, so the sperm cannot fertilize the egg. It takes about 3 months after the procedure for the tubes to become blocked. You must use another form of birth control for these 3 months.  Female sterilization. This is when the female has the tubes that carry sperm tied off (vasectomy).This blocks sperm from entering the vagina during sexual intercourse. After the procedure, the man can still ejaculate fluid (semen). NATURAL PLANNING METHODS  Natural family planning. This is not having sexual intercourse or using a barrier method (condom, diaphragm, cervical cap) on days the woman could become pregnant.  Calendar method. This is keeping track of the length of each menstrual cycle and identifying when you are fertile.  Ovulation method. This is avoiding sexual intercourse during ovulation.  Symptothermal  method. This is avoiding sexual intercourse during ovulation, using a thermometer and ovulation symptoms.  Post-ovulation method. This is timing sexual intercourse after you have ovulated. Regardless of which type or method of contraception you choose, it is important that you use condoms to protect against the transmission of sexually transmitted infections (STIs). Talk with your health care provider about which form of contraception is most appropriate for you.   This information is not intended to replace advice given to you by your health care provider. Make sure you discuss any questions you have with your health care provider.   Document Released: 02/01/2005 Document Revised: 02/06/2013 Document Reviewed: 07/27/2012 Elsevier Interactive Patient Education 2016 [employer] Breastfeeding and Mastitis Mastitis is inflammation of the breast tissue. It can occur in women who are breastfeeding. This can make breastfeeding painful. Mastitis will sometimes go away on its own. Your health care provider will help determine if treatment is needed. CAUSES Mastitis is often associated with a blocked milk (lactiferous) duct. This can happen when too much milk builds up in the breast. Causes of excess milk in the breast can include:  Poor latch-on. If your baby is not latched onto the breast properly, she or he may not empty your breast completely while breastfeeding.  Allowing too much time to pass between feedings.  Wearing a bra or other clothing that is  too tight. This puts extra pressure on the lactiferous ducts so milk does not flow through them as it should. Mastitis can also be caused by a bacterial infection. Bacteria may enter the breast tissue through cuts or openings in the skin. In women who are breastfeeding, this may occur because of cracked or irritated skin. Cracks in the skin are often caused when your baby does not latch on properly to the breast. SIGNS AND SYMPTOMS  Swelling,  redness, tenderness, and pain in an area of the breast.  Swelling of the glands under the arm on the same side.  Fever may or may not accompany mastitis. If an infection is allowed to progress, a collection of pus (abscess) may develop. DIAGNOSIS  Your health care provider can usually diagnose mastitis based on your symptoms and a physical exam. Tests may be done to help confirm the diagnosis. These may include:  Removal of pus from the breast by applying pressure to the area. This pus can be examined in the lab to determine which bacteria are present. If an abscess has developed, the fluid in the abscess can be removed with a needle. This can also be used to confirm the diagnosis and determine the bacteria present. In most cases, pus will not be present.  Blood tests to determine if your body is fighting a bacterial infection.  Mammogram or ultrasound tests to rule out other problems or diseases. TREATMENT  Mastitis that occurs with breastfeeding will sometimes go away on its own. Your health care provider may choose to wait 24 hours after first seeing you to decide whether a prescription medicine is needed. If your symptoms are worse after 24 hours, your health care provider will likely prescribe an antibiotic medicine to treat the mastitis. He or she will determine which bacteria are most likely causing the infection and will then select an appropriate antibiotic medicine. This is sometimes changed based on the results of tests performed to identify the bacteria, or if there is no response to the antibiotic medicine selected. Antibiotic medicines are usually given by mouth. You may also be given medicine for pain. HOME CARE INSTRUCTIONS  Only take over-the-counter or prescription medicines for pain, fever, or discomfort as directed by your health care provider.  If your health care provider prescribed an antibiotic medicine, take the medicine as directed. Make sure you finish it even if you  start to feel better.  Do not wear a tight or underwire bra. Wear a soft, supportive bra.  Increase your fluid intake, especially if you have a fever.  Continue to empty the breast. Your health care provider can tell you whether this milk is safe for your infant or needs to be thrown out. You may be told to stop nursing until your health care provider thinks it is safe for your baby. Use a breast pump if you are advised to stop nursing.  Keep your nipples clean and dry.  Empty the first breast completely before going to the other breast. If your baby is not emptying your breasts completely for some reason, use a breast pump to empty your breasts.  If you go back to work, pump your breasts while at work to stay in time with your nursing schedule.  Avoid allowing your breasts to become overly filled with milk (engorged). SEEK MEDICAL CARE IF:  You have pus-like discharge from the breast.  Your symptoms do not improve with the treatment prescribed by your health care provider within 2 days. SEEK IMMEDIATE  MEDICAL CARE IF: °· Your pain and swelling are getting worse. °· You have pain that is not controlled with medicine. °· You have a red line extending from the breast toward your armpit. °· You have a fever or persistent symptoms for more than 2-3 days. °· You have a fever and your symptoms suddenly get worse. °MAKE SURE YOU:  °· Understand these instructions. °· Will watch your condition. °· Will get help right away if you are not doing well or get worse. °  °This information is not intended to replace advice given to you by your health care provider. Make sure you discuss any questions you have with your health care provider. °  °Document Released: 05/29/2004 Document Revised: 02/06/2013 Document Reviewed: 09/07/2012 °Elsevier Interactive Patient Education ©2016 [employer] °Breastfeeding °Deciding to breastfeed is one of the best choices you can make for you and your baby. A change in hormones  during pregnancy causes your breast tissue to grow and increases the number and size of your milk ducts. These hormones also allow proteins, sugars, and fats from your blood supply to make breast milk in your milk-producing glands. Hormones prevent breast milk from being released before your baby is born as well as prompt milk flow after birth. Once breastfeeding has begun, thoughts of your baby, as well as his or her sucking or crying, can stimulate the release of milk from your milk-producing glands.  °BENEFITS OF BREASTFEEDING °For Your Baby °· Your first milk (colostrum) helps your baby's digestive system function better. °· There are antibodies in your milk that help your baby fight off infections. °· Your baby has a lower incidence of asthma, allergies, and sudden infant death syndrome. °· The nutrients in breast milk are better for your baby than infant formulas and are designed uniquely for your baby's needs. °· Breast milk improves your baby's brain development. °· Your baby is less likely to develop other conditions, such as childhood obesity, asthma, or type 2 diabetes mellitus. °For You °· Breastfeeding helps to create a very special bond between you and your baby. °· Breastfeeding is convenient. Breast milk is always available at the correct temperature and costs nothing. °· Breastfeeding helps to burn calories and helps you lose the weight gained during pregnancy. °· Breastfeeding makes your uterus contract to its prepregnancy size faster and slows bleeding (lochia) after you give birth.   °· Breastfeeding helps to lower your risk of developing type 2 diabetes mellitus, osteoporosis, and breast or ovarian cancer later in life. °SIGNS THAT YOUR BABY IS HUNGRY °Early Signs of Hunger °· Increased alertness or activity. °· Stretching. °· Movement of the head from side to side. °· Movement of the head and opening of the mouth when the corner of the mouth or cheek is stroked (rooting). °· Increased sucking  sounds, smacking lips, cooing, sighing, or squeaking. °· Hand-to-mouth movements. °· Increased sucking of fingers or hands. °Late Signs of Hunger °· Fussing. °· Intermittent crying. °Extreme Signs of Hunger °Signs of extreme hunger will require calming and consoling before your baby will be able to breastfeed successfully. Do not wait for the following signs of extreme hunger to occur before you initiate breastfeeding: °· Restlessness. °· A loud, strong cry. °· Screaming. °BREASTFEEDING BASICS °Breastfeeding Initiation °· Find a comfortable place to sit or lie down, with your neck and back well supported. °· Place a pillow or rolled up blanket under your baby to bring him or her to the level of your breast (if you are   seated). Nursing pillows are specially designed to help support your arms and your baby while you breastfeed.  Make sure that your baby's abdomen is facing your abdomen.  Gently massage your breast. With your fingertips, massage from your chest wall toward your nipple in a circular motion. This encourages milk flow. You may need to continue this action during the feeding if your milk flows slowly.  Support your breast with 4 fingers underneath and your thumb above your nipple. Make sure your fingers are well away from your nipple and your baby's mouth.  Stroke your baby's lips gently with your finger or nipple.  When your baby's mouth is open wide enough, quickly bring your baby to your breast, placing your entire nipple and as much of the colored area around your nipple (areola) as possible into your baby's mouth.  More areola should be visible above your baby's upper lip than below the lower lip.  Your baby's tongue should be between his or her lower gum and your breast.  Ensure that your baby's mouth is correctly positioned around your nipple (latched). Your baby's lips should create a seal on your breast and be turned out (everted).  It is common for your baby to suck about 2-3  minutes in order to start the flow of breast milk. Latching Teaching your baby how to latch on to your breast properly is very important. An improper latch can cause nipple pain and decreased milk supply for you and poor weight gain in your baby. Also, if your baby is not latched onto your nipple properly, he or she may swallow some air during feeding. This can make your baby fussy. Burping your baby when you switch breasts during the feeding can help to get rid of the air. However, teaching your baby to latch on properly is still the best way to prevent fussiness from swallowing air while breastfeeding. Signs that your baby has successfully latched on to your nipple:  Silent tugging or silent sucking, without causing you pain.  Swallowing heard between every 3-4 sucks.  Muscle movement above and in front of his or her ears while sucking. Signs that your baby has not successfully latched on to nipple:  Sucking sounds or smacking sounds from your baby while breastfeeding.  Nipple pain. If you think your baby has not latched on correctly, slip your finger into the corner of your baby's mouth to break the suction and place it between your baby's gums. Attempt breastfeeding initiation again. Signs of Successful Breastfeeding Signs from your baby:  A gradual decrease in the number of sucks or complete cessation of sucking.  Falling asleep.  Relaxation of his or her body.  Retention of a small amount of milk in his or her mouth.  Letting go of your breast by himself or herself. Signs from you:  Breasts that have increased in firmness, weight, and size 1-3 hours after feeding.  Breasts that are softer immediately after breastfeeding.  Increased milk volume, as well as a change in milk consistency and color by the fifth day of breastfeeding.  Nipples that are not sore, cracked, or bleeding. Signs That Your [employer] is Getting Enough Milk  Wetting at least 3 diapers in a 24-hour period. The  urine should be clear and pale yellow by age 40 days.  At least 3 stools in a 24-hour period by age 40 days. The stool should be soft and yellow.  At least 3 stools in a 24-hour period by age 23 days. The  stool should be seedy and yellow.  No loss of weight greater than 10% of birth weight during the first 34 days of age.  Average weight gain of 4-7 ounces (113-198 g) per week after age 18 days.  Consistent daily weight gain by age 7 days, without weight loss after the age of 2 weeks. After a feeding, your baby may spit up a small amount. This is common. BREASTFEEDING FREQUENCY AND DURATION Frequent feeding will help you make more milk and can prevent sore nipples and breast engorgement. Breastfeed when you feel the need to reduce the fullness of your breasts or when your baby shows signs of hunger. This is called "breastfeeding on demand." Avoid introducing a pacifier to your baby while you are working to establish breastfeeding (the first 4-6 weeks after your baby is born). After this time you may choose to use a pacifier. Research has shown that pacifier use during the first year of a baby's life decreases the risk of sudden infant death syndrome (SIDS). Allow your baby to feed on each breast as long as he or she wants. Breastfeed until your baby is finished feeding. When your baby unlatches or falls asleep while feeding from the first breast, offer the second breast. Because newborns are often sleepy in the first few weeks of life, you may need to awaken your baby to get him or her to feed. Breastfeeding times will vary from baby to baby. However, the following rules can serve as a guide to help you ensure that your baby is properly fed:  Newborns (babies 11 weeks of age or younger) may breastfeed every 1-3 hours.  Newborns should not go longer than 3 hours during the day or 5 hours during the night without breastfeeding.  You should breastfeed your baby a minimum of 8 times in a 24-hour period  until you begin to introduce solid foods to your baby at around 37 months of age. BREAST MILK PUMPING Pumping and storing breast milk allows you to ensure that your baby is exclusively fed your breast milk, even at times when you are unable to breastfeed. This is especially important if you are going back to work while you are still breastfeeding or when you are not able to be present during feedings. Your lactation consultant can give you guidelines on how long it is safe to store breast milk. A breast pump is a machine that allows you to pump milk from your breast into a sterile bottle. The pumped breast milk can then be stored in a refrigerator or freezer. Some breast pumps are operated by hand, while others use electricity. Ask your lactation consultant which type will work best for you. Breast pumps can be purchased, but some hospitals and breastfeeding support groups lease breast pumps on a monthly basis. A lactation consultant can teach you how to hand express breast milk, if you prefer not to use a pump. CARING FOR YOUR BREASTS WHILE YOU BREASTFEED Nipples can become dry, cracked, and sore while breastfeeding. The following recommendations can help keep your breasts moisturized and healthy:  Avoid using soap on your nipples.  Wear a supportive bra. Although not required, special nursing bras and tank tops are designed to allow access to your breasts for breastfeeding without taking off your entire bra or top. Avoid wearing underwire-style bras or extremely tight bras.  Air dry your nipples for 3-43mnutes after each feeding.  Use only cotton bra pads to absorb leaked breast milk. Leaking of breast milk between feedings  is normal.  Use lanolin on your nipples after breastfeeding. Lanolin helps to maintain your skin's normal moisture barrier. If you use pure lanolin, you do not need to wash it off before feeding your baby again. Pure lanolin is not toxic to your baby. You may also hand express a  few drops of breast milk and gently massage that milk into your nipples and allow the milk to air dry. In the first few weeks after giving birth, some women experience extremely full breasts (engorgement). Engorgement can make your breasts feel heavy, warm, and tender to the touch. Engorgement peaks within 3-5 days after you give birth. The following recommendations can help ease engorgement:  Completely empty your breasts while breastfeeding or pumping. You may want to start by applying warm, moist heat (in the shower or with warm water-soaked hand towels) just before feeding or pumping. This increases circulation and helps the milk flow. If your baby does not completely empty your breasts while breastfeeding, pump any extra milk after he or she is finished.  Wear a snug bra (nursing or regular) or tank top for 1-2 days to signal your body to slightly decrease milk production.  Apply ice packs to your breasts, unless this is too uncomfortable for you.  Make sure that your baby is latched on and positioned properly while breastfeeding. If engorgement persists after 48 hours of following these recommendations, contact your health care provider or a Science writer. OVERALL HEALTH CARE RECOMMENDATIONS WHILE BREASTFEEDING  Eat healthy foods. Alternate between meals and snacks, eating 3 of each per day. Because what you eat affects your breast milk, some of the foods may make your baby more irritable than usual. Avoid eating these foods if you are sure that they are negatively affecting your baby.  Drink milk, fruit juice, and water to satisfy your thirst (about 10 glasses a day).  Rest often, relax, and continue to take your prenatal vitamins to prevent fatigue, stress, and anemia.  Continue breast self-awareness checks.  Avoid chewing and smoking tobacco. Chemicals from cigarettes that pass into breast milk and exposure to secondhand smoke may harm your baby.  Avoid alcohol and drug use,  including marijuana. Some medicines that may be harmful to your baby can pass through breast milk. It is important to ask your health care provider before taking any medicine, including all over-the-counter and prescription medicine as well as vitamin and herbal supplements. It is possible to become pregnant while breastfeeding. If birth control is desired, ask your health care provider about options that will be safe for your baby. SEEK MEDICAL CARE IF:  You feel like you want to stop breastfeeding or have become frustrated with breastfeeding.  You have painful breasts or nipples.  Your nipples are cracked or bleeding.  Your breasts are red, tender, or warm.  You have a swollen area on either breast.  You have a fever or chills.  You have nausea or vomiting.  You have drainage other than breast milk from your nipples.  Your breasts do not become full before feedings by the fifth day after you give birth.  You feel sad and depressed.  Your baby is too sleepy to eat well.  Your baby is having trouble sleeping.   Your baby is wetting less than 3 diapers in a 24-hour period.  Your baby has less than 3 stools in a 24-hour period.  Your baby's skin or the white part of his or her eyes becomes yellow.   Your baby is  not gaining weight by 5 days of age. °SEEK IMMEDIATE MEDICAL CARE IF: °· Your baby is overly tired (lethargic) and does not want to wake up and feed. °· Your baby develops an unexplained fever. °  °This information is not intended to replace advice given to you by your health care provider. Make sure you discuss any questions you have with your health care provider. °  °Document Released: 02/01/2005 Document Revised: 10/23/2014 Document Reviewed: 07/26/2012 °Elsevier Interactive Patient Education ©2016 [employer] °Anemia, Nonspecific °Anemia is a condition in which the concentration of red blood cells or hemoglobin in the blood is below normal. Hemoglobin is a substance  in red blood cells that carries oxygen to the tissues of the body. Anemia results in not enough oxygen reaching these tissues.  °CAUSES  °Common causes of anemia include:  °· Excessive bleeding. Bleeding may be internal or external. This includes excessive bleeding from periods (in women) or from the intestine.   °· Poor nutrition.   °· Chronic kidney, thyroid, and liver disease.  °· Bone marrow disorders that decrease red blood cell production. °· Cancer and treatments for cancer. °· HIV, AIDS, and their treatments. °· Spleen problems that increase red blood cell destruction. °· Blood disorders. °· Excess destruction of red blood cells due to infection, medicines, and autoimmune disorders. °SIGNS AND SYMPTOMS  °· Minor weakness.   °· Dizziness.   °· Headache. °· Palpitations.   °· Shortness of breath, especially with exercise.   °· Paleness. °· Cold sensitivity. °· Indigestion. °· Nausea. °· Difficulty sleeping. °· Difficulty concentrating. °Symptoms may occur suddenly or they may develop slowly.  °DIAGNOSIS  °Additional blood tests are often needed. These help your health care provider determine the best treatment. Your health care provider will check your stool for blood and look for other causes of blood loss.  °TREATMENT  °Treatment varies depending on the cause of the anemia. Treatment can include:  °· Supplements of iron, vitamin B12, or folic acid.   °· Hormone medicines.   °· A blood transfusion. This may be needed if blood loss is severe.   °· Hospitalization. This may be needed if there is significant continual blood loss.   °· Dietary changes. °· Spleen removal. °HOME CARE INSTRUCTIONS °Keep all follow-up appointments. It often takes many weeks to correct anemia, and having your health care provider check on your condition and your response to treatment is very important. °SEEK IMMEDIATE MEDICAL CARE IF:  °· You develop extreme weakness, shortness of breath, or chest pain.   °· You become dizzy or have  trouble concentrating. °· You develop heavy vaginal bleeding.   °· You develop a rash.   °· You have bloody or black, tarry stools.   °· You faint.   °· You vomit up blood.   °· You vomit repeatedly.   °· You have abdominal pain. °· You have a fever or persistent symptoms for more than 2-3 days.   °· You have a fever and your symptoms suddenly get worse.   °· You are dehydrated.   °MAKE SURE YOU: °· Understand these instructions. °· Will watch your condition. °· Will get help right away if you are not doing well or get worse. °  °This information is not intended to replace advice given to you by your health care provider. Make sure you discuss any questions you have with your health care provider. °  °Document Released: 03/11/2004 Document Revised: 10/04/2012 Document Reviewed: 07/28/2012 °Elsevier Interactive Patient Education ©2016 Else[employer] Inc[employer] °Iron-Rich Diet °Iron is a mineral that helps your body to produce hemoglobin. Hemoglobin is a protein in   your red blood cells that carries oxygen to your body's tissues. Eating too little iron may cause you to feel weak and tired, and it can increase your risk for infection. Eating enough iron is necessary for your body's metabolism, muscle function, and nervous system. °Iron is naturally found in many foods. It can also be added to foods or fortified in foods. There are two types of dietary iron: °· Heme iron. Heme iron is absorbed by the body more easily than nonheme iron. Heme iron is found in meat, poultry, and fish. °· Nonheme iron. Nonheme iron is found in dietary supplements, iron-fortified grains, beans, and vegetables. °You may need to follow an iron-rich diet if: °· You have been diagnosed with iron deficiency or iron-deficiency anemia. °· You have a condition that prevents you from absorbing dietary iron, such as: °¨ Infection in your intestines. °¨ Celiac disease. This involves long-lasting (chronic) inflammation of your intestines. °· You do not eat enough  iron. °· You eat a diet that is high in foods that impair iron absorption. °· You have lost a lot of blood. °· You have heavy bleeding during your menstrual cycle. °· You are pregnant. °WHAT IS MY PLAN? °Your health care provider may help you to determine how much iron you need per day based on your condition. Generally, when a person consumes sufficient amounts of iron in the diet, the following iron needs are met: °· Men. °¨ 14-18 years old: 11 mg per day. °¨ 19-50 years old: 8 mg per day. °· Women.   °¨ 14-18 years old: 15 mg per day. °¨ 19-50 years old: 18 mg per day. °¨ Over 50 years old: 8 mg per day. °¨ Pregnant women: 27 mg per day. °¨ Breastfeeding women: 9 mg per day. °WHAT DO I NEED TO KNOW ABOUT AN IRON-RICH DIET? °· Eat fresh fruits and vegetables that are high in vitamin C along with foods that are high in iron. This will help increase the amount of iron that your body absorbs from food, especially with foods containing nonheme iron. Foods that are high in vitamin C include oranges, peppers, tomatoes, and mango. °· Take iron supplements only as directed by your health care provider. Overdose of iron can be life-threatening. If you were prescribed iron supplements, take them with orange juice or a vitamin C supplement. °· Cook foods in pots and pans that are made from iron.   °· Eat nonheme iron-containing foods alongside foods that are high in heme iron. This helps to improve your iron absorption.   °· Certain foods and drinks contain compounds that impair iron absorption. Avoid eating these foods in the same meal as iron-rich foods or with iron supplements. These include: °¨ Coffee, black tea, and red wine. °¨ Milk, dairy products, and foods that are high in calcium. °¨ Beans, soybeans, and peas. °¨ Whole grains. °· When eating foods that contain both nonheme iron and compounds that impair iron absorption, follow these tips to absorb iron better.   °¨ Soak beans overnight before cooking. °¨ Soak whole  grains overnight and drain them before using. °¨ Ferment flours before baking, such as using yeast in bread dough. °WHAT FOODS CAN I EAT? °Grains  °Iron-fortified breakfast cereal. Iron-fortified whole-wheat bread. Enriched rice. Sprouted grains. °Vegetables  °Spinach. Potatoes with skin. Green peas. Broccoli. Red and green bell peppers. Fermented vegetables. °Fruits  °Prunes. Raisins. Oranges. Strawberries. Mango. Grapefruit. °Meats and Other Protein Sources  °Beef liver. Oysters. Beef. Shrimp. Turkey. Chicken. Tuna. Sardines. Chickpeas. Nuts. Tofu. °Beverages  °Tomato juice. Fresh   orange juice. Prune juice. Hibiscus tea. Fortified instant breakfast shakes. Condiments Tahini. Fermented soy sauce. Sweets and Desserts Black-strap molasses.  Other Wheat germ. The items listed above may not be a complete list of recommended foods or beverages. Contact your dietitian for more options. WHAT FOODS ARE NOT RECOMMENDED? Grains Whole grains. Bran cereal. Bran flour. Oats. Vegetables Artichokes. Brussels sprouts. Kale. Fruits Blueberries. Raspberries. Strawberries. Figs. Meats and Other Protein Sources Soybeans. Products made from soy protein. Dairy Milk. Cream. Cheese. Yogurt. Cottage cheese. Beverages Coffee. Black tea. Red wine. Sweets and Desserts Cocoa. Chocolate. Ice cream. Other Basil. Oregano. Parsley. The items listed above may not be a complete list of foods and beverages to avoid. Contact your dietitian for more information.   This information is not intended to replace advice given to you by your health care provider. Make sure you discuss any questions you have with your health care provider.   Document Released: 09/15/2004 Document Revised: 02/22/2014 Document Reviewed: 08/29/2013 Elsevier Interactive Patient Education 2016 [employer]. Postpartum Depression and Baby Blues The postpartum period begins right after the birth of a baby. During this time, there is  often a great amount of joy and excitement. It is also a time of many changes in the life of the parents. Regardless of how many times a mother gives birth, each child brings new challenges and dynamics to the family. It is not unusual to have feelings of excitement along with confusing shifts in moods, emotions, and thoughts. All mothers are at risk of developing postpartum depression or the "baby blues." These mood changes can occur right after giving birth, or they may occur many months after giving birth. The baby blues or postpartum depression can be mild or severe. Additionally, postpartum depression can go away rather quickly, or it can be a long-term condition.  CAUSES Raised hormone levels and the rapid drop in those levels are thought to be a main cause of postpartum depression and the baby blues. A number of hormones change during and after pregnancy. Estrogen and progesterone usually decrease right after the delivery of your baby. The levels of thyroid hormone and various cortisol steroids also rapidly drop. Other factors that play a role in these mood changes include major life events and genetics.  RISK FACTORS If you have any of the following risks for the baby blues or postpartum depression, know what symptoms to watch out for during the postpartum period. Risk factors that may increase the likelihood of getting the baby blues or postpartum depression include:  Having a personal or family history of depression.   Having depression while being pregnant.   Having premenstrual mood issues or mood issues related to oral contraceptives.  Having a lot of life stress.   Having marital conflict.   Lacking a social support network.   Having a baby with special needs.   Having health problems, such as diabetes.  SIGNS AND SYMPTOMS Symptoms of baby blues include:  Brief changes in mood, such as going from extreme happiness to sadness.  Decreased concentration.   Difficulty  sleeping.   Crying spells, tearfulness.   Irritability.   Anxiety.  Symptoms of postpartum depression typically begin within the first month after giving birth. These symptoms include:  Difficulty sleeping or excessive sleepiness.   Marked weight loss.   Agitation.   Feelings of worthlessness.   Lack of interest in activity or food.  Postpartum psychosis is a very serious condition and can be dangerous. Fortunately, it is rare. Displaying any  of the following symptoms is cause for immediate medical attention. Symptoms of postpartum psychosis include:   Hallucinations and delusions.   Bizarre or disorganized behavior.   Confusion or disorientation.  DIAGNOSIS  A diagnosis is made by an evaluation of your symptoms. There are no medical or lab tests that lead to a diagnosis, but there are various questionnaires that a health care provider may use to identify those with the baby blues, postpartum depression, or psychosis. Often, a screening tool called the [place] Postnatal Depression Scale is used to diagnose depression in the postpartum period.  TREATMENT The baby blues usually goes away on its own in 1-2 weeks. Social support is often all that is needed. You will be encouraged to get adequate sleep and rest. Occasionally, you may be given medicines to help you sleep.  Postpartum depression requires treatment because it can last several months or longer if it is not treated. Treatment may include individual or group therapy, medicine, or both to address any social, physiological, and psychological factors that may play a role in the depression. Regular exercise, a healthy diet, rest, and social support may also be strongly recommended.  Postpartum psychosis is more serious and needs treatment right away. Hospitalization is often needed. HOME CARE INSTRUCTIONS  Get as much rest as you can. Nap when the baby sleeps.   Exercise regularly. Some women find yoga and walking  to be beneficial.   Eat a balanced and nourishing diet.   Do little things that you enjoy. Have a cup of tea, take a bubble bath, read your favorite magazine, or listen to your favorite music.  Avoid alcohol.   Ask for help with household chores, cooking, grocery shopping, or running errands as needed. Do not try to do everything.   Talk to people close to you about how you are feeling. Get support from your partner, family members, friends, or other new moms.  Try to stay positive in how you think. Think about the things you are grateful for.   Do not spend a lot of time alone.   Only take over-the-counter or prescription medicine as directed by your health care provider.  Keep all your postpartum appointments.   Let your health care provider know if you have any concerns.  SEEK MEDICAL CARE IF: You are having a reaction to or problems with your medicine. SEEK IMMEDIATE MEDICAL CARE IF:  You have suicidal feelings.   You think you may harm the baby or someone else. MAKE SURE YOU:  Understand these instructions.  Will watch your condition.  Will get help right away if you are not doing well or get worse.   This information is not intended to replace advice given to you by your health care provider. Make sure you discuss any questions you have with your health care provider.   Document Released: 11/06/2003 Document Revised: 02/06/2013 Document Reviewed: 11/13/2012 Elsevier Interactive Patient Education 2016 [place]. Postpartum Care After Vaginal Delivery After you deliver your newborn (postpartum period), the usual stay in the hospital is 24-72 hours. If there were problems with your labor or delivery, or if you have other medical problems, you might be in the hospital longer.  While you are in the hospital, you will receive help and instructions on how to care for yourself and your newborn during the postpartum period.  While you are in the hospital:  Be  sure to tell your nurses if you have pain or discomfort, as well as where you feel  the pain and what makes the pain worse. °· If you had an incision made near your vagina (episiotomy) or if you had some tearing during delivery, the nurses may put ice packs on your episiotomy or tear. The ice packs may help to reduce the pain and swelling. °· If you are breastfeeding, you may feel uncomfortable contractions of your uterus for a couple of weeks. This is normal. The contractions help your uterus get back to normal size. °· It is normal to have some bleeding after delivery. °¨ For the first 1-3 days after delivery, the flow is red and the amount may be similar to a period. °¨ It is common for the flow to start and stop. °¨ In the first few days, you may pass some small clots. Let your nurses know if you begin to pass large clots or your flow increases. °¨ Do not  flush blood clots down the toilet before having the nurse look at them. °¨ During the next 3-10 days after delivery, your flow should become more watery and pink or brown-tinged in color. °¨ Ten to fourteen days after delivery, your flow should be a small amount of yellowish-white discharge. °¨ The amount of your flow will decrease over the first few weeks after delivery. Your flow may stop in 6-8 weeks. Most women have had their flow stop by 12 weeks after delivery. °· You should change your sanitary pads frequently. °· Wash your hands thoroughly with soap and water for at least 20 seconds after changing pads, using the toilet, or before holding or feeding your newborn. °· You should feel like you need to empty your bladder within the first 6-8 hours after delivery. °· In case you become weak, lightheaded, or faint, call your nurse before you get out of bed for the first time and before you take a shower for the first time. °· Within the first few days after delivery, your breasts may begin to feel tender and full. This is called engorgement. Breast tenderness  usually goes away within 48-72 hours after engorgement occurs. You may also notice milk leaking from your breasts. If you are not breastfeeding, do not stimulate your breasts. Breast stimulation can make your breasts produce more milk. °· Spending as much time as possible with your newborn is very important. During this time, you and your newborn can feel close and get to know each other. Having your newborn stay in your room (rooming in) will help to strengthen the bond with your newborn.  It will give you time to get to know your newborn and become comfortable caring for your newborn. °· Your hormones change after delivery. Sometimes the hormone changes can temporarily cause you to feel sad or tearful. These feelings should not last more than a few days. If these feelings last longer than that, you should talk to your caregiver. °· If desired, talk to your caregiver about methods of family planning or contraception. °· Talk to your caregiver about immunizations. Your caregiver may want you to have the following immunizations before leaving the hospital: °¨ Tetanus, diphtheria, and pertussis (Tdap) or tetanus and diphtheria (Td) immunization. It is very important that you and your family (including grandparents) or others caring for your newborn are up-to-date with the Tdap or Td immunizations. The Tdap or Td immunization can help protect your newborn from getting ill. °¨ Rubella immunization. °¨ Varicella (chickenpox) immunization. °¨ Influenza immunization. You should receive this annual immunization if you did not receive the immunization during your   pregnancy. °  °This information is not intended to replace advice given to you by your health care provider. Make sure you discuss any questions you have with your health care provider. °  °Document Released: 11/29/2006 Document Revised: 10/27/2011 Document Reviewed: 09/29/2011 °Elsevier Interactive Patient Education ©2016 Elsevier Inc. ° °

## 2016-07-26 ENCOUNTER — Emergency Department (HOSPITAL_COMMUNITY)
Admission: EM | Admit: 2016-07-26 | Discharge: 2016-07-26 | Disposition: A | Discharge status: Home / Self Care | Payer: Medicaid Other | Attending: Emergency Medicine | Admitting: Emergency Medicine

## 2016-07-26 ENCOUNTER — Encounter (HOSPITAL_COMMUNITY): Payer: Self-pay | Admitting: Emergency Medicine

## 2016-07-26 DIAGNOSIS — K0520 Aggressive periodontitis, unspecified: Secondary | ICD-10-CM

## 2016-07-26 DIAGNOSIS — Z79899 Other long term (current) drug therapy: Secondary | ICD-10-CM | POA: Insufficient documentation

## 2016-07-26 DIAGNOSIS — F17200 Nicotine dependence, unspecified, uncomplicated: Secondary | ICD-10-CM | POA: Diagnosis not present

## 2016-07-26 DIAGNOSIS — K0889 Other specified disorders of teeth and supporting structures: Secondary | ICD-10-CM | POA: Diagnosis present

## 2016-07-26 MED ORDER — PENICILLIN V POTASSIUM 250 MG PO TABS
500.0000 mg | ORAL_TABLET | Freq: Once | ORAL | Status: AC
  Administered 2016-07-26: 500 mg via ORAL
  Filled 2016-07-26: qty 2

## 2016-07-26 MED ORDER — PENICILLIN V POTASSIUM 500 MG PO TABS: 500.0000 mg | ORAL_TABLET | Freq: Three times a day (TID) | ORAL | 0 refills | Status: DC

## 2016-07-26 MED ORDER — ACETAMINOPHEN 325 MG PO TABS
650.0000 mg | ORAL_TABLET | Freq: Once | ORAL | Status: AC
  Administered 2016-07-26: 650 mg via ORAL
  Filled 2016-07-26: qty 2

## 2016-07-26 NOTE — ED Triage Notes (Signed)
Pt is here for dental pain in right molar, some swelling noted around affected tooth.  Pt had this previously 6 months ago and was treated with penicillin which relieved this. Pt is breastfeeding and is pregnant LMP 3/16)

## 2016-07-26 NOTE — ED Provider Notes (Signed)
[facility] Provider Note   CSN: [id number] Arrival date & time: 07/26/16  1825  By signing my name below, I, Amanda Sloan, attest that this documentation has been prepared under the direction and in the presence of  [facility] M. Damian Leavell, NP. Electronically Signed: Doreatha Sloan, ED Scribe. 07/26/16. 8:47 PM.    History   Chief Complaint Chief Complaint  Patient presents with  . Dental Pain    HPI Amanda Sloan is a 21 y.o. female who presents to the Emergency Department complaining of moderate, constant right rear lower dental pain that began today with associated HA. She reports h/o similar pain to the same tooth with prior dental abscess, which was treated with Penicillin. No worsening or alleviating factors noted.  She denies nausea, vomiting, fever, chills, ear pain, SOB, difficulty swallowing or tolerating secretions.    The history is provided by the patient. No language interpreter was used.  Dental Pain   This is a recurrent problem. The current episode started 6 to 12 hours ago. The problem has been gradually worsening. The pain is moderate. She has tried nothing for the symptoms. The treatment provided no relief.    Past Medical History:  Diagnosis Date  . No pertinent past medical history     Patient Active Problem List   Diagnosis Date Noted  . Vaginal delivery 11/17/2015  . Rh negative, maternal 11/17/2015  . Late prenatal care 11/17/2015  . Family history of sickle cell anemia 11/17/2015  . Family history of cleft lip 11/17/2015    Past Surgical History:  Procedure Laterality Date  . NO PAST SURGERIES      OB History    Gravida Para Term Preterm AB Living   2 1 1     1    SAB TAB Ectopic Multiple Live Births         0 1       Home Medications    Prior to Admission medications   Medication Sig Start Date End Date Taking? Authorizing Provider  ferrous sulfate 325 (65 FE) MG tablet Take 1 tablet (325 mg total) by mouth 2 (two) times daily with a  meal. 11/19/15   Sherre Scarlet, CNM  ibuprofen (ADVIL,MOTRIN) 600 MG tablet Take 1 tablet (600 mg total) by mouth every 6 (six) hours as needed for fever, headache, mild pain, moderate pain or cramping. 11/19/15   Sherre Scarlet, CNM  penicillin v potassium (VEETID) 500 MG tablet Take 1 tablet (500 mg total) by mouth 3 (three) times daily. 07/26/16   Janne Napoleon, NP    Family History Family History  Problem Relation Age of Onset  . Other Neg Hx     Social History Social History  Substance Use Topics  . Smoking status: Current Some Day Smoker  . Smokeless tobacco: Never Used  . Alcohol use No     Allergies   Pork-derived products and Shellfish allergy   Review of Systems Review of Systems  Constitutional: Negative for chills and fever.  HENT: Positive for dental problem. Negative for ear pain, facial swelling and trouble swallowing.   Respiratory: Negative for shortness of breath.   Gastrointestinal: Negative for nausea and vomiting.  Neurological: Positive for headaches.  Hematological: Positive for adenopathy.     Physical Exam Updated Vital Signs BP 117/74 (BP Location: Right Arm)   Pulse 75   Temp 98.4 F (36.9 C) (Oral)   Resp 12   Wt 54.6 kg (120 lb 6.4 oz)   LMP 04/30/2016 (  Exact Date)   SpO2 100%   Breastfeeding? Yes   BMI 18.86 kg/m   Physical Exam  Constitutional: She appears well-developed and well-nourished. No distress.  HENT:  Head: Normocephalic.  Right Ear: Tympanic membrane normal.  Left Ear: Tympanic membrane normal.  Mouth/Throat: Uvula is midline, oropharynx is clear and moist and mucous membranes are normal. No posterior oropharyngeal edema or posterior oropharyngeal erythema.    The right lower 3rd molar is partially erupted. There is swelling and tenderness to the gums surrounding the tooth.   Eyes: Conjunctivae are normal.  Neck: Neck supple.  Cardiovascular: Normal rate and regular rhythm.   Pulmonary/Chest: Effort normal  and breath sounds normal.  Abdominal: She exhibits no distension.  Musculoskeletal: Normal range of motion.  Lymphadenopathy:    She has cervical adenopathy.  Anterior cervical node enlargement on the right.   Neurological: She is alert.  Skin: Skin is warm and dry.  Psychiatric: She has a normal mood and affect. Her behavior is normal.  Nursing note and vitals reviewed.    ED Treatments / Results   DIAGNOSTIC STUDIES: Oxygen Saturation is 98% on RA, normal by my interpretation.    COORDINATION OF CARE: 8:46 PM Discussed treatment plan with pt at bedside which includes antibiotics and pt agreed to plan.   Procedures Procedures (including critical care time)  Medications Ordered in ED Medications  penicillin v potassium (VEETID) tablet 500 mg (500 mg Oral Given 07/26/16 2117)  acetaminophen (TYLENOL) tablet 650 mg (650 mg Oral Given 07/26/16 2117)     Initial Impression / Assessment and Plan / ED Course  I have reviewed the triage vital signs and the nursing notes. Patient with toothache of the right lower 3rd molar.  No gross abscess.  Exam unconcerning for Ludwig's angina or spread of infection.  Will treat with penicillin and pain medicine.  Urged patient to follow-up with dentist.  Referral given.  Final Clinical Impressions(s) / ED Diagnoses   Final diagnoses:  Acute pericoronitis    New Prescriptions Discharge Medication List as of 07/26/2016  8:53 PM      I personally performed the services described in this documentation, which was scribed in my presence. The recorded information has been reviewed and is accurate.    Kerrie Buffaloeese, Brain Honeycutt [place], [place] 07/26/16 2210    Linwood DibblesKnapp, Jon, MD 07/28/16 [phone]

## 2016-07-26 NOTE — ED Notes (Signed)
Called pt to be triaged had no answer.

## 2016-07-26 NOTE — Discharge Instructions (Signed)
Take the antibiotic and tylenol for the dental pain and infection. Follow up with Dr. Lajean ManesIsharani. Call the office to schedule an appointment.

## 2019-12-13 ENCOUNTER — Other Ambulatory Visit: Payer: Self-pay

## 2019-12-13 DIAGNOSIS — Z20822 Contact with and (suspected) exposure to covid-19: Secondary | ICD-10-CM

## 2019-12-14 LAB — NOVEL CORONAVIRUS, NAA: SARS-CoV-2, NAA: NOT DETECTED

## 2019-12-14 LAB — SARS-COV-2, NAA 2 DAY TAT

## 2020-06-05 ENCOUNTER — Ambulatory Visit (HOSPITAL_COMMUNITY)
Admission: EM | Admit: 2020-06-05 | Discharge: 2020-06-05 | Disposition: A | Discharge status: Home / Self Care | Payer: Medicaid Other | Attending: Internal Medicine | Admitting: Internal Medicine

## 2020-06-05 ENCOUNTER — Encounter (HOSPITAL_COMMUNITY): Payer: Self-pay

## 2020-06-05 ENCOUNTER — Other Ambulatory Visit: Payer: Self-pay

## 2020-06-05 ENCOUNTER — Ambulatory Visit (INDEPENDENT_AMBULATORY_CARE_PROVIDER_SITE_OTHER): Payer: Medicaid Other

## 2020-06-05 DIAGNOSIS — S92514A Nondisplaced fracture of proximal phalanx of right lesser toe(s), initial encounter for closed fracture: Secondary | ICD-10-CM | POA: Diagnosis not present

## 2020-06-05 DIAGNOSIS — M7989 Other specified soft tissue disorders: Secondary | ICD-10-CM | POA: Diagnosis not present

## 2020-06-05 DIAGNOSIS — S99921A Unspecified injury of right foot, initial encounter: Secondary | ICD-10-CM | POA: Diagnosis not present

## 2020-06-05 DIAGNOSIS — M79671 Pain in right foot: Secondary | ICD-10-CM

## 2020-06-05 NOTE — ED Triage Notes (Signed)
Patient presents to Urgent Care with complaints of right pinky toe injury today. She states injury occurred from hitting toe on the back of her sons ankle and heard a cracking noise. She also reports numbness and tingling sensation to pinky toe. Treating pain with tylenol.

## 2020-06-05 NOTE — Discharge Instructions (Addendum)
Take anti-inflammatory agents as needed for pain Please wear your boot while walking around Follow-up with orthopedic surgery in 1 week to be evaluated.

## 2020-06-09 NOTE — ED Provider Notes (Signed)
[facility]    CSN: [id number] Arrival date & time: 06/05/20  1915      History   Chief Complaint Chief Complaint  Patient presents with  . Toe Injury    HPI Amanda Sloan is a 25 y.o. female comes to urgent care with right fifth toe pain after few hours duration.  Patient sustained injury after hitting her foot against her son's foot.  Patient describes hearing a cracking noise with sudden onset severe pain.  The pain is sharp, severe, throbbing with no known relieving factors.  It is aggravated by bearing weight and associated with numbness and tingling.  No bruising noted.  HPI  Past Medical History:  Diagnosis Date  . No pertinent past medical history     Patient Active Problem List   Diagnosis Date Noted  . Vaginal delivery 11/17/2015  . Rh negative, maternal 11/17/2015  . Late prenatal care 11/17/2015  . Family history of sickle cell anemia 11/17/2015  . Family history of cleft lip 11/17/2015    Past Surgical History:  Procedure Laterality Date  . NO PAST SURGERIES      OB History    Gravida  2   Para  1   Term  1   Preterm      AB      Living  1     SAB      IAB      Ectopic      Multiple  0   Live Births  1            Home Medications    Prior to Admission medications   Medication Sig Start Date End Date Taking? Authorizing Provider  ibuprofen (ADVIL,MOTRIN) 600 MG tablet Take 1 tablet (600 mg total) by mouth every 6 (six) hours as needed for fever, headache, mild pain, moderate pain or cramping. 11/19/15   Sherre Scarlet, CNM  ferrous sulfate 325 (65 FE) MG tablet Take 1 tablet (325 mg total) by mouth 2 (two) times daily with a meal. 11/19/15 06/05/20  Sherre Scarlet, CNM    Family History Family History  Problem Relation Age of Onset  . Diabetes Mother   . Diabetes Father   . Other Neg Hx     Social History Social History   Tobacco Use  . Smoking status: Former Smoker    Types: Cigarettes    Quit  date: 2016    Years since quitting: 6.3  . Smokeless tobacco: Never Used  Vaping Use  . Vaping Use: Never used  Substance Use Topics  . Alcohol use: No  . Drug use: Not Currently    Types: Marijuana    Comment: last smoked weed about 1900     Allergies   Pork-derived products and Shellfish allergy   Review of Systems Review of Systems  Musculoskeletal: Positive for arthralgias. Negative for back pain, gait problem and joint swelling.  Neurological: Negative.      Physical Exam Triage Vital Signs ED Triage Vitals  Enc Vitals Group     BP 06/05/20 1958 112/80     Pulse Rate 06/05/20 1958 84     Resp 06/05/20 1958 16     Temp 06/05/20 1958 97.6 F (36.4 C)     Temp Source 06/05/20 1958 Oral     SpO2 06/05/20 1958 99 %     Weight --      Height --      Head Circumference --  Peak Flow --      Pain Score 06/05/20 1953 5     Pain Loc --      Pain Edu? --      Excl. in GC? --    No data found.  Updated Vital Signs BP 112/80 (BP Location: Right Arm)   Pulse 84   Temp 97.6 F (36.4 C) (Oral)   Resp 16   LMP 05/08/2020   SpO2 99%   Breastfeeding Unknown   Visual Acuity Right Eye Distance:   Left Eye Distance:   Bilateral Distance:    Right Eye Near:   Left Eye Near:    Bilateral Near:     Physical Exam Vitals and nursing note reviewed.  Constitutional:      General: She is not in acute distress.    Appearance: She is not ill-appearing.  Cardiovascular:     Rate and Rhythm: Normal rate and regular rhythm.  Musculoskeletal:     Comments: Tenderness Palpation of the right fifth toe.  No bruising noted.  There is a slightly lateral angulation of the toe.  Neurological:     Mental Status: She is alert.      UC Treatments / Results  Labs (all labs ordered are listed, but only abnormal results are displayed) Labs Reviewed - No data to display  EKG   Radiology No results found.  Procedures Procedures (including critical care  time)  Medications Ordered in UC Medications - No data to display  Initial Impression / Assessment and Plan / UC Course  I have reviewed the triage vital signs and the nursing notes.  Pertinent labs & imaging results that were available during my care of the patient were reviewed by me and considered in my medical decision making (see chart for details).     1.  Nondisplaced fracture of the proximal phalanx of the right foot: X-ray of the right foot is remarkable for nondisplaced fracture of the proximal phalanx of the fifth phalanx of the right foot. Ibuprofen 600mg  every 6 hrs as needed Post op boot Follow-up with orthopedic surgery in 1 week.  Final Clinical Impressions(s) / UC Diagnoses   Final diagnoses:  Closed nondisplaced fracture of proximal phalanx of lesser toe of right foot, initial encounter     Discharge Instructions     Take anti-inflammatory agents as needed for pain Please wear your boot while walking around Follow-up with orthopedic surgery in 1 week to be evaluated.   ED Prescriptions    None     PDMP not reviewed this encounter.   , MD 06/09/20 1040

## 2020-08-15 ENCOUNTER — Encounter (HOSPITAL_COMMUNITY): Payer: Self-pay | Admitting: Emergency Medicine

## 2020-08-15 ENCOUNTER — Other Ambulatory Visit: Payer: Self-pay

## 2020-08-15 ENCOUNTER — Ambulatory Visit (HOSPITAL_COMMUNITY)
Admission: EM | Admit: 2020-08-15 | Discharge: 2020-08-15 | Disposition: A | Discharge status: Home / Self Care | Payer: Medicaid Other | Attending: Medical Oncology | Admitting: Medical Oncology

## 2020-08-15 DIAGNOSIS — R110 Nausea: Secondary | ICD-10-CM | POA: Insufficient documentation

## 2020-08-15 DIAGNOSIS — Z3A00 Weeks of gestation of pregnancy not specified: Secondary | ICD-10-CM | POA: Insufficient documentation

## 2020-08-15 DIAGNOSIS — U071 COVID-19: Secondary | ICD-10-CM | POA: Insufficient documentation

## 2020-08-15 DIAGNOSIS — J069 Acute upper respiratory infection, unspecified: Secondary | ICD-10-CM | POA: Insufficient documentation

## 2020-08-15 DIAGNOSIS — O26899 Other specified pregnancy related conditions, unspecified trimester: Secondary | ICD-10-CM | POA: Diagnosis present

## 2020-08-15 DIAGNOSIS — O98519 Other viral diseases complicating pregnancy, unspecified trimester: Secondary | ICD-10-CM | POA: Diagnosis not present

## 2020-08-15 DIAGNOSIS — Z87891 Personal history of nicotine dependence: Secondary | ICD-10-CM | POA: Diagnosis not present

## 2020-08-15 NOTE — ED Triage Notes (Signed)
Patient presents to [facility] for evaluation of body aches, nausea, feeling hot and cold, mild headache.  Partner started to be sick with similar symptomx s 2 days.  Patient also states approximately 2 weeks ago she had URI type symptoms with body aches, headache and cough, everything has resolved except the cough.  Also thinks there is a chance she may be pregnant (does not want this discussed in front of her kids).

## 2020-08-15 NOTE — ED Provider Notes (Signed)
[facility]    CSN: [id number] Arrival date & time: 08/15/20  1404      History   Chief Complaint Chief Complaint  Patient presents with   Nausea    HPI Amanda Sloan is a 25 y.o. female.   HPI  Nausea: Patient reports that she has had nausea since yesterday.  She reports that she has had a pregnancy test confirmed both at home and at a different medical facility and is early on her pregnancy.  She is doing well but she is starting to have some morning sickness.  She is not having any diarrhea.  She states that like her daughters and sons that she has had a runny nose and cough for about 2 weeks.  Those symptoms have been essentially stable.  She is not having any chest pain, shortness of breath, hemoptysis or wheeze.  She has not tried anything for symptoms other than Tylenol and trying to stay hydrated.  She denies any abdominal pain and there is no mention of vaginal bleeding.  Past Medical History:  Diagnosis Date   No pertinent past medical history     Patient Active Problem List   Diagnosis Date Noted   Vaginal delivery 11/17/2015   Rh negative, maternal 11/17/2015   Late prenatal care 11/17/2015   Family history of sickle cell anemia 11/17/2015   Family history of cleft lip 11/17/2015    Past Surgical History:  Procedure Laterality Date   NO PAST SURGERIES      OB History     Gravida  2   Para  1   Term  1   Preterm      AB      Living  1      SAB      IAB      Ectopic      Multiple  0   Live Births  1            Home Medications    Prior to Admission medications   Medication Sig Start Date End Date Taking? Authorizing Provider  ibuprofen (ADVIL,MOTRIN) 600 MG tablet Take 1 tablet (600 mg total) by mouth every 6 (six) hours as needed for fever, headache, mild pain, moderate pain or cramping. 11/19/15   Sherre Scarlet, CNM  ferrous sulfate 325 (65 FE) MG tablet Take 1 tablet (325 mg total) by mouth 2 (two) times daily  with a meal. 11/19/15 06/05/20  Sherre Scarlet, CNM    Family History Family History  Problem Relation Age of Onset   Diabetes Mother    Diabetes Father    Other Neg Hx     Social History Social History   Tobacco Use   Smoking status: Former    Pack years: 0.00    Types: Cigarettes    Quit date: 2016    Years since quitting: 6.5   Smokeless tobacco: Never  Vaping Use   Vaping Use: Never used  Substance Use Topics   Alcohol use: No   Drug use: Not Currently    Types: Marijuana    Comment: last smoked weed about 1900     Allergies   Pork-derived products and Shellfish allergy   Review of Systems Review of Systems  As stated above in HPI Physical Exam Triage Vital Signs ED Triage Vitals  Enc Vitals Group     BP 08/15/20 1444 (!) 109/57     Pulse Rate 08/15/20 1444 (!) 104     Resp 08/15/20  1444 18     Temp 08/15/20 1444 98.7 F (37.1 C)     Temp Source 08/15/20 1444 Oral     SpO2 08/15/20 1444 100 %     Weight --      Height --      Head Circumference --      Peak Flow --      Pain Score 08/15/20 1445 8     Pain Loc --      Pain Edu? --      Excl. in GC? --    No data found.  Updated Vital Signs BP (!) 109/57 (BP Location: Right Arm)   Pulse (!) 104   Temp 98.7 F (37.1 C) (Oral)   Resp 18   LMP 06/15/2020   SpO2 100%   Physical Exam Vitals and nursing note reviewed.  Constitutional:      General: She is not in acute distress.    Appearance: Normal appearance. She is not ill-appearing, toxic-appearing or diaphoretic.  HENT:     Head: Normocephalic and atraumatic.     Right Ear: Tympanic membrane normal. There is no impacted cerumen.     Left Ear: Tympanic membrane normal. There is no impacted cerumen.     Nose: Rhinorrhea present.     Mouth/Throat:     Mouth: Mucous membranes are moist.     Pharynx: No oropharyngeal exudate or posterior oropharyngeal erythema.  Eyes:     Extraocular Movements: Extraocular movements intact.      Pupils: Pupils are equal, round, and reactive to light.  Cardiovascular:     Rate and Rhythm: Normal rate and regular rhythm.     Pulses: Normal pulses.     Heart sounds: Normal heart sounds.  Pulmonary:     Effort: Pulmonary effort is normal.     Breath sounds: Normal breath sounds.  Abdominal:     General: Bowel sounds are normal. There is no distension.     Palpations: Abdomen is soft. There is no mass.     Tenderness: There is no abdominal tenderness. There is no right CVA tenderness, left CVA tenderness, guarding or rebound.     Hernia: No hernia is present.  Musculoskeletal:     Cervical back: Normal range of motion and neck supple.  Lymphadenopathy:     Cervical: No cervical adenopathy.  Skin:    General: Skin is warm.     Coloration: Skin is not jaundiced or pale.  Neurological:     Mental Status: She is alert and oriented to person, place, and time.     UC Treatments / Results  Labs (all labs ordered are listed, but only abnormal results are displayed) Labs Reviewed  SARS CORONAVIRUS 2 (TAT 6-24 HRS)    EKG   Radiology No results found.  Procedures Procedures (including critical care time)  Medications Ordered in UC Medications - No data to display  Initial Impression / Assessment and Plan / UC Course  I have reviewed the triage vital signs and the nursing notes.  Pertinent labs & imaging results that were available during my care of the patient were reviewed by me and considered in my medical decision making (see chart for details).     New.  Could be viral versus related to her pregnancy.  We discussed hydration with water along with Tylenol if needed.  She can use medication such as gin gin's or B6 supplementation to help with her nausea.  Discussed red flag signs symptoms. Final Clinical Impressions(s) /  UC Diagnoses   Final diagnoses:  Nausea  Acute upper respiratory infection   Discharge Instructions   None    ED Prescriptions   None     PDMP not reviewed this encounter.   Rushie Chestnut, [place] 08/15/20 1607

## 2020-08-16 LAB — SARS CORONAVIRUS 2 (TAT 6-24 HRS): SARS Coronavirus 2: POSITIVE — AB

## 2020-08-25 ENCOUNTER — Inpatient Hospital Stay (HOSPITAL_COMMUNITY): Payer: Medicaid Other

## 2020-08-25 ENCOUNTER — Other Ambulatory Visit: Payer: Self-pay

## 2020-08-25 ENCOUNTER — Inpatient Hospital Stay (HOSPITAL_COMMUNITY)
Admission: AD | Admit: 2020-08-25 | Discharge: 2020-08-25 | Disposition: A | Discharge status: Home / Self Care | Payer: Medicaid Other | Attending: Family Medicine | Admitting: Family Medicine

## 2020-08-25 ENCOUNTER — Encounter (HOSPITAL_COMMUNITY): Payer: Self-pay | Admitting: Family Medicine

## 2020-08-25 DIAGNOSIS — O26899 Other specified pregnancy related conditions, unspecified trimester: Secondary | ICD-10-CM

## 2020-08-25 DIAGNOSIS — Z3491 Encounter for supervision of normal pregnancy, unspecified, first trimester: Secondary | ICD-10-CM

## 2020-08-25 DIAGNOSIS — R109 Unspecified abdominal pain: Secondary | ICD-10-CM | POA: Diagnosis not present

## 2020-08-25 DIAGNOSIS — Z3A10 10 weeks gestation of pregnancy: Secondary | ICD-10-CM | POA: Diagnosis not present

## 2020-08-25 DIAGNOSIS — Z3A01 Less than 8 weeks gestation of pregnancy: Secondary | ICD-10-CM | POA: Diagnosis not present

## 2020-08-25 DIAGNOSIS — O26891 Other specified pregnancy related conditions, first trimester: Secondary | ICD-10-CM | POA: Diagnosis present

## 2020-08-25 DIAGNOSIS — Z87891 Personal history of nicotine dependence: Secondary | ICD-10-CM | POA: Diagnosis not present

## 2020-08-25 DIAGNOSIS — Z8616 Personal history of COVID-19: Secondary | ICD-10-CM | POA: Diagnosis not present

## 2020-08-25 LAB — CBC
HCT: 35 % — ABNORMAL LOW (ref 36.0–46.0)
Hemoglobin: 11.8 g/dL — ABNORMAL LOW (ref 12.0–15.0)
MCH: 31 pg (ref 26.0–34.0)
MCHC: 33.7 g/dL (ref 30.0–36.0)
MCV: 91.9 fL (ref 80.0–100.0)
Platelets: 201 10*3/uL (ref 150–400)
RBC: 3.81 MIL/uL — ABNORMAL LOW (ref 3.87–5.11)
RDW: 11.5 % (ref 11.5–15.5)
WBC: 8.3 10*3/uL (ref 4.0–10.5)
nRBC: 0 % (ref 0.0–0.2)

## 2020-08-25 LAB — WET PREP, GENITAL
Clue Cells Wet Prep HPF POC: NONE SEEN
Sperm: NONE SEEN
Trich, Wet Prep: NONE SEEN
Yeast Wet Prep HPF POC: NONE SEEN

## 2020-08-25 LAB — URINALYSIS, ROUTINE W REFLEX MICROSCOPIC
Bacteria, UA: NONE SEEN
Bilirubin Urine: NEGATIVE
Glucose, UA: NEGATIVE mg/dL
Hgb urine dipstick: NEGATIVE
Ketones, ur: 5 mg/dL — AB
Nitrite: NEGATIVE
Protein, ur: NEGATIVE mg/dL
Specific Gravity, Urine: 1.038 — ABNORMAL HIGH (ref 1.005–1.030)
pH: 5 (ref 5.0–8.0)

## 2020-08-25 LAB — ABO/RH
ABO/RH(D): O NEG
Antibody Screen: NEGATIVE

## 2020-08-25 LAB — POCT PREGNANCY, URINE: Preg Test, Ur: POSITIVE — AB

## 2020-08-25 LAB — HCG, QUANTITATIVE, PREGNANCY: hCG, Beta Chain, Quant, S: 234654 m[IU]/mL — ABNORMAL HIGH (ref <5)

## 2020-08-25 NOTE — Discharge Instructions (Signed)

## 2020-08-25 NOTE — MAU Note (Signed)
Last night around 10, started having extreme pain beneath belly button.  Threw up at 11, still had the pain.  Almost reminded of her ctxs how they would come and go.  This morning when she woke up, was still having them, though not as bad. Sharp pain, feels like she has been punched in the belly. 2HPT, has not been confirmed. Denies bleeding. Covid symptoms improved.

## 2020-08-25 NOTE — MAU Provider Note (Signed)
Chief Complaint:  Abdominal Pain and Possible Pregnancy    HPI: Amanda Sloan is a 25 y.o. [id number] at [redacted]w[redacted]d who presents to maternity admissions reporting abdominal pain. Patient reports that she was at a cook out yesterday afternoon and ate deviled eggs. When she got home around 10pm she had sudden onset abdominal pain around her belly button that felt as if she got punched in the stomach. She immediately had the urge to use the bathroom in which she reports she had 3 bowel movements in less than an hour. After the 3rd BM, she had one episode of what she describes as "violent vomiting for 5 minutes". She had another BM right after the vomiting episode occurred. This morning, she had about 10 bites of chicken pot pit where she began to have the some pain again followed by the urge to have a BM. She reports that she is not constipated but feels as if she isn't completely emptying her bowels. At this time, she is not having any abdominal pain as she says the pain occurs after she eats. She denies nausea and vomiting today. Denies lower abdominal pain, pelvic pain, vaginal bleeding/discharge, heartburn/indigestion or urinary s/s.  On further note, patient was diagnosed with Covid on 08/15/2020 which she reports she had a lot of GI symptoms and no upper respiratory symptoms. LMP: 06/15/2020   Pregnancy Course:   Past Medical History:  Diagnosis Date   No pertinent past medical history    OB History  Gravida Para Term Preterm AB Living  5 4 4     4   SAB IAB Ectopic Multiple Live Births        0 4    # Outcome Date GA Lbr Len/2nd Weight Sex Delivery Anes PTL Lv  5 Current           4 Term 11/17/15 [redacted]w[redacted]d 51:48 / 00:05 3039 g F Vag-Spont None  LIV  3 Term      Vag-Spont   LIV  2 Term      Vag-Spont   LIV  1 Term      Vag-Spont   LIV   Past Surgical History:  Procedure Laterality Date   NO PAST SURGERIES     Family History  Problem Relation Age of Onset   Diabetes Mother    Diabetes Father     Other Neg Hx    Social History   Tobacco Use   Smoking status: Former    Pack years: 0.00    Types: Cigarettes    Quit date: 2016    Years since quitting: 6.5   Smokeless tobacco: Never  Vaping Use   Vaping Use: Never used  Substance Use Topics   Alcohol use: No   Drug use: Not Currently    Types: Marijuana    Comment: last smoked weed about 1900   Allergies  Allergen Reactions   Pork-Derived Products     For religious reasons   Shellfish Allergy     For religious reasons   Medications Prior to Admission  Medication Sig Dispense Refill Last Dose   ibuprofen (ADVIL,MOTRIN) 600 MG tablet Take 1 tablet (600 mg total) by mouth every 6 (six) hours as needed for fever, headache, mild pain, moderate pain or cramping. 30 tablet 2     I have reviewed patient's Past Medical Hx, Surgical Hx, Family Hx, Social Hx, medications and allergies.   ROS:  Review of Systems  Constitutional: Negative.   Respiratory: Negative.  Cardiovascular: Negative.   Gastrointestinal:  Positive for abdominal pain, nausea and vomiting. Negative for constipation and diarrhea.  Genitourinary: Negative.   Musculoskeletal: Negative.   Neurological: Negative.   Psychiatric/Behavioral: Negative.     Physical Exam  Patient Vitals for the past 24 hrs:  BP Temp Temp src Pulse Resp SpO2 Height Weight  08/25/20 2001 101/69 -- -- 81 -- -- -- --  08/25/20 1554 106/65 97.9 F (36.6 C) Oral 77 17 100 % 5\' 7"  (1.702 m) 67.7 kg   Constitutional: well-developed, well-nourished female in no acute distress.  Cardiovascular: normal rate Respiratory: normal effort GI: abd soft, non-tender, no guarding, no rebound tenderness MS: extremities nontender, no edema, normal ROM Neurologic: alert and oriented x 4.  GU: neg CVAT. Pelvic: deferred, blind swabs obtained    Labs: Results for orders placed or performed during the hospital encounter of 08/25/20 (from the past 24 hour(s))  Pregnancy, urine POC     Status:  Abnormal   Collection Time: 08/25/20  4:00 PM  Result Value Ref Range   Preg Test, Ur POSITIVE (A) NEGATIVE  Urinalysis, Routine w reflex microscopic Urine, Clean Catch     Status: Abnormal   Collection Time: 08/25/20  4:03 PM  Result Value Ref Range   Color, Urine YELLOW YELLOW   APPearance HAZY (A) CLEAR   Specific Gravity, Urine 1.038 (H) 1.005 - 1.030   pH 5.0 5.0 - 8.0   Glucose, UA NEGATIVE NEGATIVE mg/dL   Hgb urine dipstick NEGATIVE NEGATIVE   Bilirubin Urine NEGATIVE NEGATIVE   Ketones, ur 5 (A) NEGATIVE mg/dL   Protein, ur NEGATIVE NEGATIVE mg/dL   Nitrite NEGATIVE NEGATIVE   Leukocytes,Ua TRACE (A) NEGATIVE   RBC / HPF 0-5 0 - 5 RBC/hpf   WBC, UA 0-5 0 - 5 WBC/hpf   Bacteria, UA NONE SEEN NONE SEEN   Squamous Epithelial / LPF 0-5 0 - 5   Mucus PRESENT   hCG, quantitative, pregnancy     Status: Abnormal   Collection Time: 08/25/20  4:30 PM  Result Value Ref Range   hCG, Beta Chain, Quant, S 234,654 (H) <5 mIU/mL  CBC     Status: Abnormal   Collection Time: 08/25/20  4:30 PM  Result Value Ref Range   WBC 8.3 4.0 - 10.5 K/uL   RBC 3.81 (L) 3.87 - 5.11 MIL/uL   Hemoglobin 11.8 (L) 12.0 - 15.0 g/dL   HCT 10/26/20 (L) 64.4 - 03.4 %   MCV 91.9 80.0 - 100.0 fL   MCH 31.0 26.0 - 34.0 pg   MCHC 33.7 30.0 - 36.0 g/dL   RDW 74.2 59.5 - 63.8 %   Platelets 201 150 - 400 K/uL   nRBC 0.0 0.0 - 0.2 %  ABO/Rh     Status: None   Collection Time: 08/25/20  4:30 PM  Result Value Ref Range   ABO/RH(D) O NEG    Antibody Screen      NEG Performed at [facility], [address] [place], [place]   Wet prep, genital     Status: Abnormal   Collection Time: 08/25/20  4:48 PM   Specimen: Vaginal  Result Value Ref Range   Yeast Wet Prep HPF POC NONE SEEN NONE SEEN   Trich, Wet Prep NONE SEEN NONE SEEN   Clue Cells Wet Prep HPF POC NONE SEEN NONE SEEN   WBC, Wet Prep HPF POC MODERATE (A) NONE SEEN   Sperm NONE SEEN  Imaging:  US OB Comp Less 14 Wks  Result  Date: 08/25/2020 CLINICAL DATA:  Abdominal pain EXAM: OBSTETRIC <14 WK ULTRASOUND TECHNIQUE: Transabdominal ultrasound was performed for evaluation of the gestation as well as the maternal uterus and adnexal regions. COMPARISON:  None. FINDINGS: Intrauterine gestational sac: Single Yolk sac:  Visualized. Embryo:  Visualized. Cardiac Activity: Visualized. Heart Rate: 158 bpm CRL:   11.6 mm   7 w 2 d                  [place] EDC: 04/11/2021 Subchorionic hemorrhage:  None visualized. Maternal uterus/adnexae: Uterus and bilateral ovaries within normal limits. No free fluid within the pelvis. IMPRESSION: 1. Single live intrauterine gestation measuring 7 weeks 2 days by crown-rump length. 2. Active embryonic heart tones at 158 beats per minute. Electronically Signed   By: Duanne Guess D.O.   On: 08/25/2020 19:44    MAU Course: Orders Placed This Encounter  Procedures   Wet prep, genital   US OB Comp Less 14 Wks   Urinalysis, Routine w reflex microscopic Urine, Clean Catch   hCG, quantitative, pregnancy   CBC   Pregnancy, urine POC   ABO/Rh   No orders of the defined types were placed in this encounter.   MDM: UA CBC, HCG  Blood type O neg, not bleeding, Rhogam not indicated Wet prep, GC/CT collected [place] with live IUP No nausea, vomiting or diarrhea during stay Patient was offered antiemetics but declines Pain likely related to viral syndrome or food poisoning, recommend patient advance diet slowly. Avoid spicy, greasy foods. BRAT diet   Assessment: 1. Abdominal pain affecting pregnancy   2. Normal IUP (intrauterine pregnancy) on prenatal ultrasound, first trimester     Plan: Discharge home in stable condition Establish prenatal care at [facility]. Patient considering [facility] vs. [facility] facility.    Allergies as of 08/25/2020       Reactions   Pork-derived Products    For religious reasons   Shellfish Allergy    For religious reasons        Medication List      STOP taking these medications    ibuprofen 600 MG tablet Commonly known as: ADVIL         Camelia Eng, MSN, CNM 08/25/2020 7:29 PM

## 2020-08-26 LAB — GC/CHLAMYDIA PROBE AMP (~~LOC~~) NOT AT ARMC
Chlamydia: NEGATIVE
Comment: NEGATIVE
Comment: NORMAL
Neisseria Gonorrhea: NEGATIVE

## 2021-08-02 ENCOUNTER — Ambulatory Visit (HOSPITAL_COMMUNITY)
Admission: EM | Admit: 2021-08-02 | Discharge: 2021-08-02 | Disposition: A | Discharge status: Home / Self Care | Payer: Medicaid Other | Attending: Physician Assistant | Admitting: Physician Assistant

## 2021-08-02 ENCOUNTER — Encounter (HOSPITAL_COMMUNITY): Payer: Self-pay

## 2021-08-02 DIAGNOSIS — H60501 Unspecified acute noninfective otitis externa, right ear: Secondary | ICD-10-CM | POA: Diagnosis not present

## 2021-08-02 MED ORDER — CIPROFLOXACIN-DEXAMETHASONE 0.3-0.1 % OT SUSP: 4.0000 [drp] | Freq: Two times a day (BID) | OTIC | 0 refills | Status: DC

## 2021-08-02 NOTE — ED Provider Notes (Signed)
[facility]    CSN: [id number] Arrival date & time: 08/02/21  1012      History   Chief Complaint Chief Complaint  Patient presents with   Otalgia    HPI Any A Amanda Sloan is a 26 y.o. female.   Patient presents today with a weeklong history of right otalgia.  She denies any significant other symptoms including fever, Amanda Sloan, Amanda Sloan, Amanda Sloan, Amanda Sloan, Amanda Sloan, Amanda Sloan, Amanda Sloan, Amanda Sloan.  She initially thought symptoms were related to wax and used a Q-tip which then exacerbated pain.  She reports a muffled hearing sensation as well as discomfort in the ear.  She denies history of diabetes or immunosuppression.  She has tried acetaminophen without improvement of symptoms.  She is confident that she is not pregnant as she gave birth approximately 4 weeks ago.  She is actively breast-feeding.    Past Medical History:  Diagnosis Date   No pertinent past medical history     Patient Active Problem List   Diagnosis Date Noted   Vaginal delivery 11/17/2015   Rh negative, maternal 11/17/2015   Late prenatal care 11/17/2015   Family history of sickle cell anemia 11/17/2015   Family history of cleft lip 11/17/2015    Past Surgical History:  Procedure Laterality Date   NO PAST SURGERIES      OB History     Gravida  5   Para  4   Term  4   Preterm      AB      Living  4      SAB      IAB      Ectopic      Multiple  0   Live Births  4            Home Medications    Prior to Admission medications   Medication Sig Start Date End Date Taking? Authorizing Provider  ciprofloxacin-dexamethasone (CIPRODEX) OTIC suspension Place 4 drops into the right ear 2 (two) times daily. 08/02/21  Yes Klyde Banka, Noberto Retort, PA-C  ferrous sulfate 325 (65 FE) MG  tablet Take 1 tablet (325 mg total) by mouth 2 (two) times daily with a meal. 11/19/15 06/05/20  Sherre Scarlet, CNM    Family History Family History  Problem Relation Age of Onset   Diabetes Mother    Diabetes Father    Other Neg Hx     Social History Social History   Tobacco Sloan   Smoking status: Former    Types: Cigarettes    Quit date: 2016    Years since quitting: 7.4   Smokeless tobacco: Never  Vaping Sloan   Vaping Sloan: Never used  Substance Sloan Topics   Alcohol Sloan: No   Drug Sloan: Not Currently    Types: Marijuana    Comment: last smoked weed about 1900     Allergies   Pork-derived products and Shellfish allergy   Review of Systems Review of Systems  Constitutional:  Positive for activity change. Negative for appetite change, fatigue and fever.  HENT:  Positive for ear pain and hearing loss (Muffled). Negative for congestion, ear discharge, sinus pressure, sneezing and sore throat.   Respiratory:  Negative for Amanda Sloan and shortness of breath.   Cardiovascular:  Negative for chest pain.  Gastrointestinal:  Negative for abdominal pain, diarrhea, Amanda Sloan and Amanda Sloan.  Neurological:  Negative for Amanda Sloan, light-headedness and headaches.     Physical Exam Triage Vital Signs ED Triage Vitals [08/02/21 1116]  Enc Vitals Group     BP 113/78     Pulse Rate 78     Resp 16     Temp 98.3 F (36.8 C)     Temp Source Oral     SpO2 100 %     Weight      Height      Head Circumference      Peak Flow      Pain Score      Pain Loc      Pain Edu?      Excl. in GC?    No data found.  Updated Vital Signs BP 113/78 (BP Location: Left Arm)   Pulse 78   Temp 98.3 F (36.8 C) (Oral)   Resp 16   SpO2 100%   Visual Acuity Right Eye Distance:   Left Eye Distance:   Bilateral Distance:    Right Eye Near:   Left Eye Near:    Bilateral Near:     Physical Exam Vitals reviewed.  Constitutional:      General: She is awake. She is not in acute distress.     Appearance: Normal appearance. She is well-developed. She is not ill-appearing.     Comments: Very pleasant female presented age in no acute distress sitting comfortably in exam room  HENT:     Head: Normocephalic and atraumatic.     Right Ear: Tympanic membrane, ear canal and external ear normal. Swelling and tenderness present. Tympanic membrane is not erythematous or bulging.     Left Ear: Tympanic membrane, ear canal and external ear normal. Tympanic membrane is not erythematous or bulging.     Ears:     Comments: Tenderness palpation of tragus.  Pain with manipulation of external ear.  Edema noted external ear canal.  Normal-appearing TM    Nose:     Right Sinus: Maxillary sinus tenderness present. No frontal sinus tenderness.     Left Sinus: Maxillary sinus tenderness and frontal sinus tenderness present.     Mouth/Throat:     Pharynx: Uvula midline. No oropharyngeal exudate or posterior oropharyngeal erythema.  Cardiovascular:     Rate and Rhythm: Normal rate and regular rhythm.     Heart sounds: Normal heart sounds, S1 normal and S2 normal. No murmur heard. Pulmonary:     Effort: Pulmonary effort is normal.     Breath sounds: Normal breath sounds. No wheezing, rhonchi or rales.     Comments: Clear to auscultation bilaterally Psychiatric:        Behavior: Behavior is cooperative.      UC Treatments / Results  Labs (all labs ordered are listed, but only abnormal results are displayed) Labs Reviewed - No data to display  EKG   Radiology No results found.  Procedures Procedures (including critical care time)  Medications Ordered in UC Medications - No data to display  Initial Impression / Assessment and Plan / UC Course  I have reviewed the triage vital signs and the nursing notes.  Pertinent labs & imaging results that were available during my care of the patient were reviewed by me and considered in my medical decision making (see chart for details).      Concern for otitis externa given clinical presentation.  We will start Ciprodex twice daily for 7 days.  Discussed that she should keep ear canal facing upward for a few minutes after starting medication to allow complete penetration throughout ear canal.  Recommended over-the-counter analgesics as needed for pain relief.  Discussed that if symptoms are not improving she should return for reevaluation.  If anything worsens and she develops change in character of drainage, increased pain, fever, erythema of ear, Amanda Sloan/Amanda Sloan she needs to be seen immediately to which she expressed understanding.  Strict return precautions given.  At the end of visit patient reported that she would like to have her hemoglobin checked.  Discussed that we could send this off but ultimately decided not to wait to have blood drawn.  She denies any significant symptoms and is nontachycardic in clinic today.  Recommended follow-up with PCP.  Reported she did not have a PCP so we will try to establish her with someone via PCP assistance.  Discussed that if she has any worsening symptoms including shortness of breath, palpitations, increased fatigue she should be seen by our clinic to initiate work-up.  Final Clinical Impressions(s) / UC Diagnoses   Final diagnoses:  Acute otitis externa of right ear, unspecified type     Discharge Instructions      Sloan drops twice daily for 7 days.  Keep your ear facing upward for several minutes after applying drops to allow it to go all the way through the ear canal.  Can Sloan over-the-counter medications including acetaminophen and ibuprofen for pain.  If you have any worsening symptoms including change in drainage, fever, increased pain, Amanda Sloan/Amanda Sloan he should be seen immediately.     ED Prescriptions     Medication Sig Dispense Auth. Provider   ciprofloxacin-dexamethasone (CIPRODEX) OTIC suspension Place 4 drops into the right ear 2 (two) times daily. 7.5 mL Jaree Trinka K,  PA-C      PDMP not reviewed this encounter.   Jeani Hawking, PA-C 08/02/21 1139

## 2021-08-02 NOTE — Discharge Instructions (Signed)
Use drops twice daily for 7 days.  Keep your ear facing upward for several minutes after applying drops to allow it to go all the way through the ear canal.  Can use over-the-counter medications including acetaminophen and ibuprofen for pain.  If you have any worsening symptoms including change in drainage, fever, increased pain, nausea/vomiting he should be seen immediately.

## 2021-08-14 ENCOUNTER — Encounter (HOSPITAL_COMMUNITY): Payer: Self-pay

## 2023-02-16 ENCOUNTER — Encounter (HOSPITAL_COMMUNITY): Payer: Self-pay | Admitting: Emergency Medicine

## 2023-02-16 ENCOUNTER — Ambulatory Visit (HOSPITAL_COMMUNITY)
Admission: EM | Admit: 2023-02-16 | Discharge: 2023-02-16 | Disposition: A | Discharge status: Home / Self Care | Payer: Medicaid Other | Attending: Emergency Medicine | Admitting: Emergency Medicine

## 2023-02-16 ENCOUNTER — Ambulatory Visit (INDEPENDENT_AMBULATORY_CARE_PROVIDER_SITE_OTHER): Payer: Medicaid Other

## 2023-02-16 ENCOUNTER — Other Ambulatory Visit: Payer: Self-pay

## 2023-02-16 DIAGNOSIS — S99921A Unspecified injury of right foot, initial encounter: Secondary | ICD-10-CM | POA: Diagnosis not present

## 2023-02-16 DIAGNOSIS — Z23 Encounter for immunization: Secondary | ICD-10-CM | POA: Diagnosis not present

## 2023-02-16 DIAGNOSIS — S91311A Laceration without foreign body, right foot, initial encounter: Secondary | ICD-10-CM | POA: Diagnosis not present

## 2023-02-16 DIAGNOSIS — S96909A Unspecified injury of unspecified muscle and tendon at ankle and foot level, unspecified foot, initial encounter: Secondary | ICD-10-CM

## 2023-02-16 DIAGNOSIS — T148XXA Other injury of unspecified body region, initial encounter: Secondary | ICD-10-CM | POA: Diagnosis not present

## 2023-02-16 MED ORDER — TETANUS-DIPHTH-ACELL PERTUSSIS 5-2.5-18.5 LF-MCG/0.5 IM SUSY
0.5000 mL | PREFILLED_SYRINGE | Freq: Once | INTRAMUSCULAR | Status: AC
  Administered 2023-02-16: 0.5 mL via INTRAMUSCULAR

## 2023-02-16 MED ORDER — SULFAMETHOXAZOLE-TRIMETHOPRIM 800-160 MG PO TABS: 1.0000 | ORAL_TABLET | Freq: Two times a day (BID) | ORAL | 0 refills | Status: AC

## 2023-02-16 MED ORDER — TETANUS-DIPHTH-ACELL PERTUSSIS 5-2.5-18.5 LF-MCG/0.5 IM SUSY
PREFILLED_SYRINGE | INTRAMUSCULAR | Status: AC
  Filled 2023-02-16: qty 0.5

## 2023-02-16 NOTE — ED Notes (Addendum)
 Dressing applied to pt's right foot wound below great toe. Coban & non-adherent pad applied per Vernal PA.

## 2023-02-16 NOTE — ED Provider Notes (Addendum)
 [facility]    CSN: [id number] Arrival date & time: 02/16/23  1104    HISTORY  No chief complaint on file.  HPI Amanda Sloan is a pleasant, 28 y.o. female who presents to urgent care today. Patient states that at 1030 this morning she had taken off her right shoe to scratch the top of her foot with her left shoe not realizing that there was a piece of glass embedded in the sole of her left shoe.  While doing so, the piece of glass embedded in the sole of her left shoe became embedded in the top of her right foot, injuring at an angle just at the top of her right great toe, with the tip of the piece of glass going toward the tip of her right great toe.  Patient states that when she realized what it happened, she pulled a piece of glass back out, states that approximately 1 inch of glass entered her foot.  Patient states there is minimal bleeding at this time but reports difficulty wiggling her right great toe.  Patient was able to ambulate independently into the clinic.  The history is provided by the patient.   Past Medical History:  Diagnosis Date   No pertinent past medical history    Patient Active Problem List   Diagnosis Date Noted   Vaginal delivery 11/17/2015   Rh negative, maternal 11/17/2015   Late prenatal care 11/17/2015   Family history of sickle cell anemia 11/17/2015   Family history of cleft lip 11/17/2015   Past Surgical History:  Procedure Laterality Date   NO PAST SURGERIES     OB History     Gravida  6   Para  4   Term  4   Preterm      AB      Living  4      SAB      IAB      Ectopic      Multiple  0   Live Births  4          Home Medications    Prior to Admission medications   Medication Sig Start Date End Date Taking? Authorizing Provider  ciprofloxacin -dexamethasone  (CIPRODEX ) OTIC suspension Place 4 drops into the right ear 2 (two) times daily. Patient not taking: Reported on 02/16/2023 08/02/21   Raspet, Rocky POUR,  PA-C  ferrous sulfate  325 (65 FE) MG tablet Take 1 tablet (325 mg total) by mouth 2 (two) times daily with a meal. 11/19/15 06/05/20  Trudy Iha, CNM    Family History Family History  Problem Relation Age of Onset   Diabetes Mother    Diabetes Father    Other Neg Hx    Social History Social History   Tobacco Use   Smoking status: Former    Current packs/day: 0.00    Types: Cigarettes    Quit date: 2016    Years since quitting: 9.0   Smokeless tobacco: Never  Vaping Use   Vaping status: Never Used  Substance Use Topics   Alcohol use: No   Drug use: Not Currently    Types: Marijuana    Comment: last smoked weed about 1900   Allergies   Pork-derived products and Shellfish allergy  Review of Systems Review of Systems Pertinent findings revealed after performing a 14 point review of systems has been noted in the history of present illness.  Physical Exam Vital Signs BP 104/70 (BP Location: Right Arm)   Pulse  83   Temp 98.1 F (36.7 C) (Oral)   Resp 18   LMP 11/05/2022 Comment: has appt january 13  SpO2 97%   No data found.  Physical Exam Vitals and nursing note reviewed.  Constitutional:      General: She is not in acute distress.    Appearance: Normal appearance.  HENT:     Head: Normocephalic and atraumatic.  Eyes:     Pupils: Pupils are equal, round, and reactive to light.  Cardiovascular:     Rate and Rhythm: Normal rate and regular rhythm.  Pulmonary:     Effort: Pulmonary effort is normal.     Breath sounds: Normal breath sounds.  Musculoskeletal:     Cervical back: Normal range of motion and neck supple.     Right foot: Decreased range of motion (Flexion of right great toe). No deformity.       Feet:  Skin:    General: Skin is warm and dry.  Neurological:     General: No focal deficit present.     Mental Status: She is alert and oriented to person, place, and time. Mental status is at baseline.  Psychiatric:        Mood and Affect: Mood  normal.        Behavior: Behavior normal.        Thought Content: Thought content normal.        Judgment: Judgment normal.     Visual Acuity Right Eye Distance:   Left Eye Distance:   Bilateral Distance:    Right Eye Near:   Left Eye Near:    Bilateral Near:     UC Couse / Diagnostics / Procedures:     Radiology No results found.  Procedures Procedures (including critical care time) EKG  Pending results:  Labs Reviewed - No data to display  Medications Ordered in UC: Medications  Tdap (BOOSTRIX ) injection 0.5 mL (0.5 mLs Intramuscular Given 02/16/23 1215)    UC Diagnoses / Final Clinical Impressions(s)   I have reviewed the triage vital signs and the nursing notes.  Pertinent labs & imaging results that were available during my care of the patient were reviewed by me and considered in my medical decision making (see chart for details).    Final diagnoses:  Injury of right foot, initial encounter  Deep puncture wound  Laceration of right foot, initial encounter  Injury of tendon of toe   Patient advised that x-ray did not reveal any signs of a foreign body or injury to the bone.  Patient advised to follow-up with orthopedic foot specialist for further evaluation of the flexor tendon of right great toe.  Patient provided with Bactrim  for infection prophylaxis.  Recommend daily dressing changes and soaking foot in warm Epsom salt water to keep wound clean.  Wound, being a deep puncture wound, does not require closure.  Patient provided with a Boostrix  during her visit today.  Conservative care recommended.  Return precautions advised.  Addendum: Patient was notified of negative x-ray report by provider.  Please see discharge instructions below for details of plan of care as provided to patient. ED Prescriptions     Medication Sig Dispense Auth. Provider   sulfamethoxazole -trimethoprim  (BACTRIM  DS) 800-160 MG tablet Take 1 tablet by mouth 2 (two) times daily for 7  days. 14 tablet Joesph Shaver Scales, PA-C      PDMP not reviewed this encounter.  Pending results:  Labs Reviewed - No data to display    Discharge  Instructions      The x-ray did not show any evidence of retained glass in your foot meaning there does not appear to be any foreign object occupying any extra space in your foot.  As we discussed, x-rays are not good for evaluating tendons so more advanced imaging will be needed if there is concern for your tendon being significantly entered at this time.  I recommend that you follow-up with orthopedics as soon as possible for further evaluation.  I have provided you with contact information for [facility] where they have a walk-in clinic.  Please go there tomorrow morning.  In the meantime, I recommend that you continue wearing a postop shoe for comfort and to avoid further injury.  To address the wound on your foot, please soak your foot in a basin of warm water with 1/4 to 1/2 cup of Epsom salt for 20 minutes once daily in between dressing changes.  To prevent infection of your wound, please begin Bactrim  1 tablet twice daily for the next 7 days.  Please continue to monitor your wound for signs of infection which would include redness, swelling, purulent drainage or red streaking on your foot.  If it is occur, please go to the emergency room soon as possible.  Thank you for visiting [facility] today.      Disposition Upon Discharge:  Condition: stable for discharge home  Patient presented with an acute illness with associated systemic symptoms and significant discomfort requiring urgent management. In my opinion, this is a condition that a prudent lay person (someone who possesses an average knowledge of health and medicine) may potentially expect to result in complications if not addressed urgently such as respiratory distress, impairment of bodily function or dysfunction of bodily organs.   Routine  symptom specific, illness specific and/or disease specific instructions were discussed with the patient and/or caregiver at length.   As such, the patient has been evaluated and assessed, work-up was performed and treatment was provided in alignment with urgent care protocols and evidence based medicine.  Patient/parent/caregiver has been advised that the patient may require follow up for further testing and treatment if the symptoms continue in spite of treatment, as clinically indicated and appropriate.  Patient/parent/caregiver has been advised to return to the [facility] or PCP if no better; to PCP or the Emergency Department if new signs and symptoms develop, or if the current signs or symptoms continue to change or worsen for further workup, evaluation and treatment as clinically indicated and appropriate  The patient will follow up with their current PCP if and as advised. If the patient does not currently have a PCP we will assist them in obtaining one.   The patient may need specialty follow up if the symptoms continue, in spite of conservative treatment and management, for further workup, evaluation, consultation and treatment as clinically indicated and appropriate.  Patient/parent/caregiver verbalized understanding and agreement of plan as discussed.  All questions were addressed during visit.  Please see discharge instructions below for further details of plan.  This office note has been dictated using Teaching laboratory technician.  Unfortunately, this method of dictation can sometimes lead to typographical or grammatical errors.  I apologize for your inconvenience in advance if this occurs.  Please do not hesitate to reach out to me if clarification is needed.      Joesph Shaver Scales, PA-C 02/16/23 1225    Joesph Shaver Scales, [place] 02/16/23 1310

## 2023-02-16 NOTE — Discharge Instructions (Signed)
 The x-ray did not show any evidence of retained glass in your foot meaning there does not appear to be any foreign object occupying any extra space in your foot.  As we discussed, x-rays are not good for evaluating tendons so more advanced imaging will be needed if there is concern for your tendon being significantly entered at this time.  I recommend that you follow-up with orthopedics as soon as possible for further evaluation.  I have provided you with contact information for [facility] where they have a walk-in clinic.  Please go there tomorrow morning.  In the meantime, I recommend that you continue wearing a postop shoe for comfort and to avoid further injury.  To address the wound on your foot, please soak your foot in a basin of warm water with 1/4 to 1/2 cup of Epsom salt for 20 minutes once daily in between dressing changes.  To prevent infection of your wound, please begin Bactrim  1 tablet twice daily for the next 7 days.  Please continue to monitor your wound for signs of infection which would include redness, swelling, purulent drainage or red streaking on your foot.  If it is occur, please go to the emergency room soon as possible.  Thank you for visiting [facility] today.

## 2023-02-16 NOTE — L&D Delivery Note (Signed)
 Labor Progress: Amanda Sloan is a 28 y.o. female [phone] with IUP at [redacted]w[redacted]d admitted for PROM. She was admitted initially on 07/22/23 but left AMA when active labor did not begin.  She returned on 07/23/23 and was augmented with Cytotec, Foley Balloon. FHR tracing with variables when foley balloon was in but these resolved as soon as foley was removed. It was deflated from 60 to 40cc, then the 40 cc was removed prior to removal. Pt was 3 cm when foley removed.  She then progressed to more painful contractions and with Category I FHR tracing with contractions was assisted to hydrotherapy in the tub.  Within 30 minutes of entering the tub, she began pushing involuntarily.  She continued to push intermittently and involuntarily without directed or coached pushing by RN or CNM for ~ 2 hours.   She was assisted from the tub to the bed and cervix checked by CNM. She was found to be 6/80/-2. Pt requested epidural.  CNM left room for epidural placement and was called back to room as infant was delivered rapidly by RN.  There was slow delivery of shoulders vs poor maternal effort after head delivered.  Cord was clamped and cut by FOB and infant taken to warmer. NICU team paged to room and infant with slow transition but did improve and was returned to maternal abdomen and placed skin to skin.  Placenta intact and spontaneous, bleeding minimal.  Intact perineum, no repair.  Mom and baby stable prior to transfer to postpartum. She plans on breastfeeding.    Delivery Note At 4:22 PM a viable and healthy female was delivered via Vaginal, Spontaneous (Presentation: Left Occiput Anterior).  APGAR: 1, 6; 10, weight 5 lb 13.5 oz (2650 g).   Placenta status: Spontaneous, Intact.  Cord: 3 vessels with the following complications: None.  Cord pH: 7.36  Anesthesia: None Episiotomy: None Lacerations: None Suture Repair: n/a Est. Blood Loss (mL): 146  Mom to postpartum.  Baby to Couplet care / Skin to Skin.  Arlester Bence 07/23/2023, 7:44 PM

## 2023-02-16 NOTE — ED Triage Notes (Signed)
 Cut top of right foot with a piece of glass.  Patient wiped the top of right foot with the bottom of left foot.  Did not realize a piece of glass was on bottom of left foot/shoe.  Glass cut top of right foot.  Patient reports she pulled glass out of foot.  This occurred around 10:30 am this morning.  Patient has foot wrapped with coban.  Unknown when last received a tetanus .  Wound appears to have skin pealed back, partial avulsion.  Patient reports glass was wedged under skin and she pulled it out.  The wound is below the great toe.

## 2023-05-30 DIAGNOSIS — Z3480 Encounter for supervision of other normal pregnancy, unspecified trimester: Secondary | ICD-10-CM | POA: Insufficient documentation

## 2023-05-31 ENCOUNTER — Encounter: Admitting: Obstetrics and Gynecology

## 2023-05-31 ENCOUNTER — Encounter: Payer: Self-pay | Admitting: Obstetrics and Gynecology

## 2023-05-31 ENCOUNTER — Encounter

## 2023-05-31 ENCOUNTER — Other Ambulatory Visit (HOSPITAL_COMMUNITY)
Admission: RE | Admit: 2023-05-31 | Discharge: 2023-05-31 | Disposition: A | Discharge status: Home / Self Care | Source: Ambulatory Visit | Attending: Obstetrics and Gynecology | Admitting: Obstetrics and Gynecology

## 2023-05-31 ENCOUNTER — Ambulatory Visit: Admitting: Obstetrics and Gynecology

## 2023-05-31 VITALS — BP 109/73 | HR 81 | Wt 150.0 lb

## 2023-05-31 DIAGNOSIS — O99013 Anemia complicating pregnancy, third trimester: Secondary | ICD-10-CM

## 2023-05-31 DIAGNOSIS — Z6791 Unspecified blood type, Rh negative: Secondary | ICD-10-CM

## 2023-05-31 DIAGNOSIS — F419 Anxiety disorder, unspecified: Secondary | ICD-10-CM

## 2023-05-31 DIAGNOSIS — Z124 Encounter for screening for malignant neoplasm of cervix: Secondary | ICD-10-CM | POA: Insufficient documentation

## 2023-05-31 DIAGNOSIS — O0930 Supervision of pregnancy with insufficient antenatal care, unspecified trimester: Secondary | ICD-10-CM

## 2023-05-31 DIAGNOSIS — Z3A29 29 weeks gestation of pregnancy: Secondary | ICD-10-CM | POA: Diagnosis not present

## 2023-05-31 DIAGNOSIS — Z1151 Encounter for screening for human papillomavirus (HPV): Secondary | ICD-10-CM | POA: Insufficient documentation

## 2023-05-31 DIAGNOSIS — Z113 Encounter for screening for infections with a predominantly sexual mode of transmission: Secondary | ICD-10-CM | POA: Insufficient documentation

## 2023-05-31 DIAGNOSIS — F32A Depression, unspecified: Secondary | ICD-10-CM

## 2023-05-31 DIAGNOSIS — O0933 Supervision of pregnancy with insufficient antenatal care, third trimester: Secondary | ICD-10-CM

## 2023-05-31 DIAGNOSIS — Z31430 Encounter of female for testing for genetic disease carrier status for procreative management: Secondary | ICD-10-CM

## 2023-05-31 DIAGNOSIS — Z9189 Other specified personal risk factors, not elsewhere classified: Secondary | ICD-10-CM

## 2023-05-31 DIAGNOSIS — D509 Iron deficiency anemia, unspecified: Secondary | ICD-10-CM

## 2023-05-31 DIAGNOSIS — B3731 Acute candidiasis of vulva and vagina: Secondary | ICD-10-CM

## 2023-05-31 DIAGNOSIS — O99343 Other mental disorders complicating pregnancy, third trimester: Secondary | ICD-10-CM | POA: Diagnosis not present

## 2023-05-31 DIAGNOSIS — O360931 Maternal care for other rhesus isoimmunization, third trimester, fetus 1: Secondary | ICD-10-CM

## 2023-05-31 DIAGNOSIS — Z3480 Encounter for supervision of other normal pregnancy, unspecified trimester: Secondary | ICD-10-CM | POA: Insufficient documentation

## 2023-05-31 DIAGNOSIS — O09899 Supervision of other high risk pregnancies, unspecified trimester: Secondary | ICD-10-CM

## 2023-05-31 DIAGNOSIS — O09893 Supervision of other high risk pregnancies, third trimester: Secondary | ICD-10-CM

## 2023-05-31 DIAGNOSIS — A599 Trichomoniasis, unspecified: Secondary | ICD-10-CM

## 2023-05-31 DIAGNOSIS — Z720 Tobacco use: Secondary | ICD-10-CM

## 2023-05-31 DIAGNOSIS — F4310 Post-traumatic stress disorder, unspecified: Secondary | ICD-10-CM
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
## 2023-05-31 NOTE — Progress Notes (Signed)
 New OB, late to care at 29.4 GA.

## 2023-06-01 ENCOUNTER — Encounter: Payer: Self-pay | Admitting: Obstetrics and Gynecology

## 2023-06-01 DIAGNOSIS — A5901 Trichomonal vulvovaginitis: Secondary | ICD-10-CM | POA: Insufficient documentation

## 2023-06-01 DIAGNOSIS — Z2839 Other underimmunization status: Secondary | ICD-10-CM

## 2023-06-01 DIAGNOSIS — O09899 Supervision of other high risk pregnancies, unspecified trimester: Secondary | ICD-10-CM | POA: Insufficient documentation

## 2023-06-01 HISTORY — DX: Other underimmunization status: Z28.39

## 2023-06-01 LAB — CBC/D/PLT+RPR+RH+ABO+RUBIGG...
Antibody Screen: NEGATIVE
Basophils Absolute: 0 10*3/uL (ref 0.0–0.2)
Basos: 0 %
EOS (ABSOLUTE): 0.1 10*3/uL (ref 0.0–0.4)
Eos: 2 %
HCV Ab: NONREACTIVE
HIV Screen 4th Generation wRfx: NONREACTIVE
Hematocrit: 32.1 % — ABNORMAL LOW (ref 34.0–46.6)
Hemoglobin: 10.8 g/dL — ABNORMAL LOW (ref 11.1–15.9)
Hepatitis B Surface Ag: NEGATIVE
Immature Grans (Abs): 0 10*3/uL (ref 0.0–0.1)
Immature Granulocytes: 1 %
Lymphocytes Absolute: 1.2 10*3/uL (ref 0.7–3.1)
Lymphs: 19 %
MCH: 31.1 pg (ref 26.6–33.0)
MCHC: 33.6 g/dL (ref 31.5–35.7)
MCV: 93 fL (ref 79–97)
Monocytes Absolute: 0.5 10*3/uL (ref 0.1–0.9)
Monocytes: 7 %
Neutrophils Absolute: 4.6 10*3/uL (ref 1.4–7.0)
Neutrophils: 71 %
Platelets: 177 10*3/uL (ref 150–450)
RBC: 3.47 x10E6/uL — ABNORMAL LOW (ref 3.77–5.28)
RDW: 11.4 % — ABNORMAL LOW (ref 11.7–15.4)
RPR Ser Ql: NONREACTIVE
Rh Factor: NEGATIVE
Rubella Antibodies, IGG: <0.9 {index} — ABNORMAL LOW (ref >0.99)
WBC: 6.5 10*3/uL (ref 3.4–10.8)

## 2023-06-01 LAB — CERVICOVAGINAL ANCILLARY ONLY
Bacterial Vaginitis (gardnerella): POSITIVE — AB
Candida Glabrata: NEGATIVE
Candida Vaginitis: POSITIVE — AB
Chlamydia: NEGATIVE
Comment: NEGATIVE
Comment: NEGATIVE
Comment: NEGATIVE
Comment: NEGATIVE
Comment: NEGATIVE
Comment: NORMAL
Neisseria Gonorrhea: NEGATIVE
Trichomonas: POSITIVE — AB

## 2023-06-01 LAB — HCV INTERPRETATION

## 2023-06-01 LAB — HEMOGLOBIN A1C
Est. average glucose Bld gHb Est-mCnc: 105 mg/dL
Hgb A1c MFr Bld: 5.3 % (ref 4.8–5.6)

## 2023-06-01 MED ORDER — METRONIDAZOLE 500 MG PO TABS: 500.0000 mg | ORAL_TABLET | Freq: Two times a day (BID) | ORAL | 0 refills | Status: AC

## 2023-06-01 MED ORDER — FERROUS SULFATE 325 (65 FE) MG PO TABS: 325.0000 mg | ORAL_TABLET | ORAL | 1 refills | Status: DC

## 2023-06-01 MED ORDER — CLOTRIMAZOLE 1 % VA CREA: 1.0000 | TOPICAL_CREAM | Freq: Every day | VAGINAL | 1 refills | Status: AC

## 2023-06-02 ENCOUNTER — Encounter: Payer: Self-pay | Admitting: Obstetrics and Gynecology

## 2023-06-02 LAB — CULTURE, OB URINE

## 2023-06-02 LAB — URINE CULTURE, OB REFLEX

## 2023-06-02 IMAGING — US US OB COMP LESS 14 WK
1 series · 15 of 28 positions shown · non-contrast
Comparison: None.

CLINICAL DATA: Abdominal pain

EXAM:
OBSTETRIC <14 WK ULTRASOUND
TECHNIQUE: Transabdominal ultrasound was performed for evaluation of the
gestation as well as the maternal uterus and adnexal regions.

[Series 1: us ob comp less 14 wk · 15 of 40 slices shown]
[im 1/40]
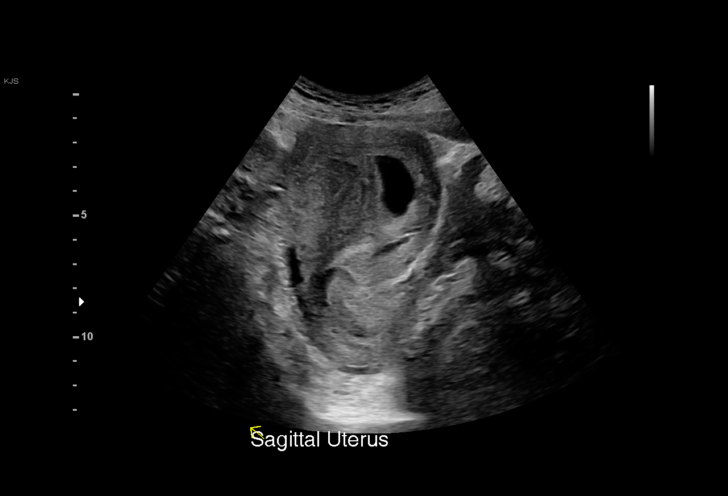
[im 3/40]
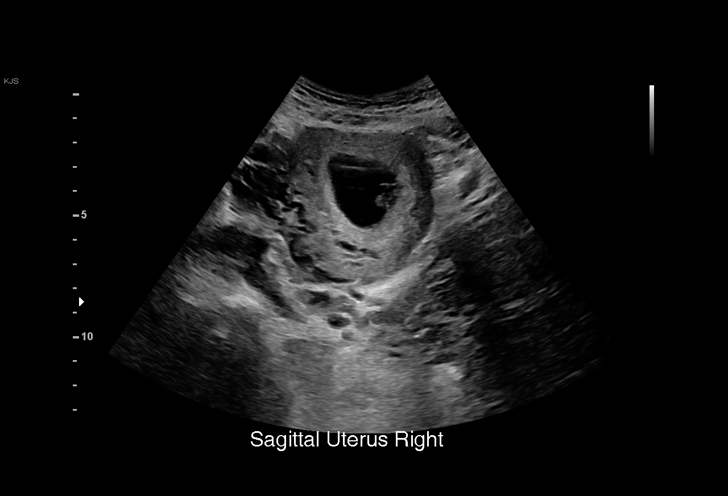
[im 6/40]
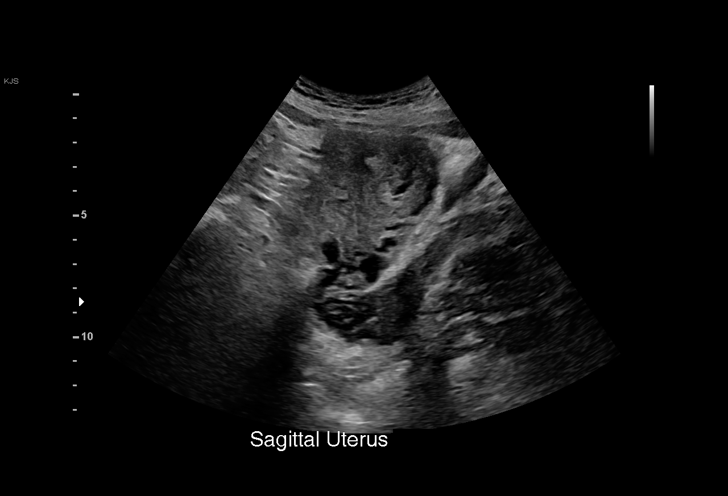
[im 9/40]
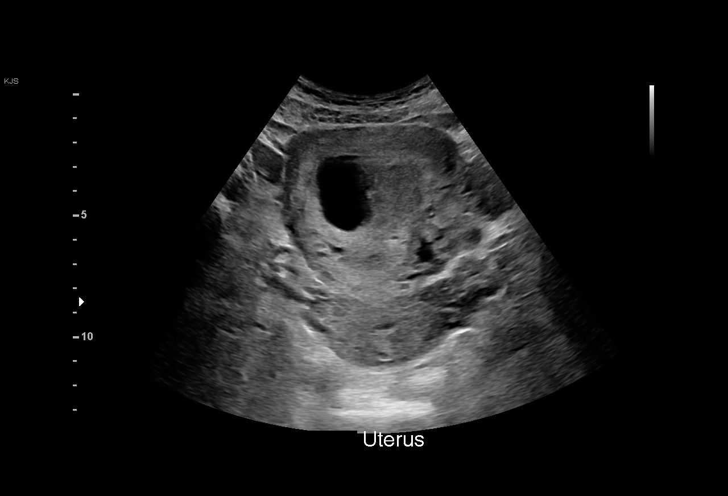
[im 12/40]
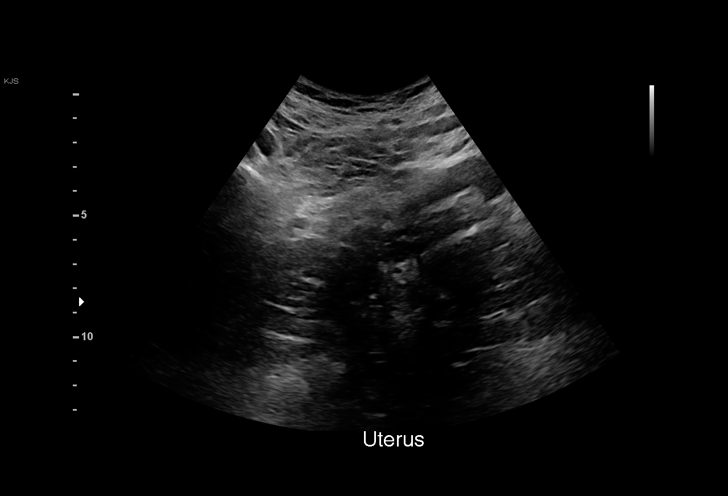
[im 15/40]
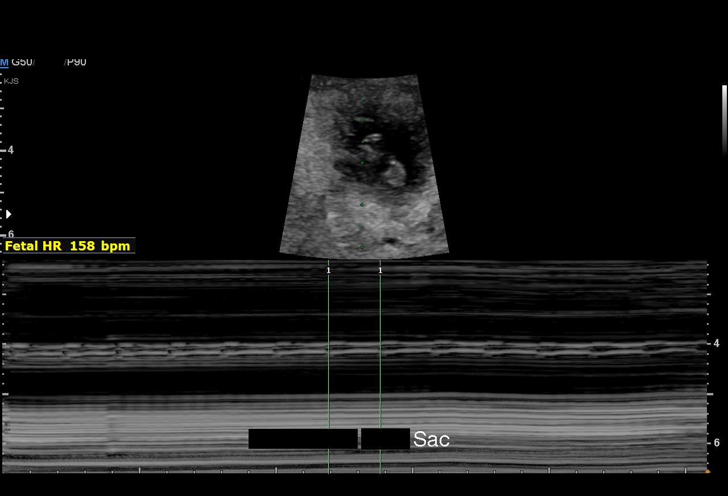
[im 18/40]
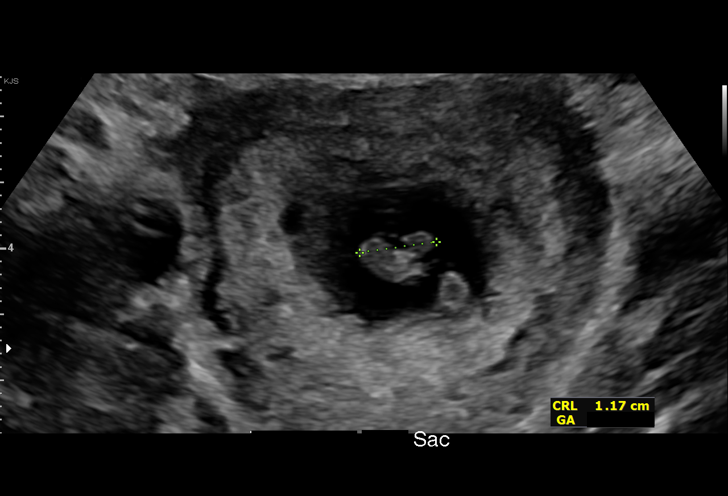
[im 21/40]
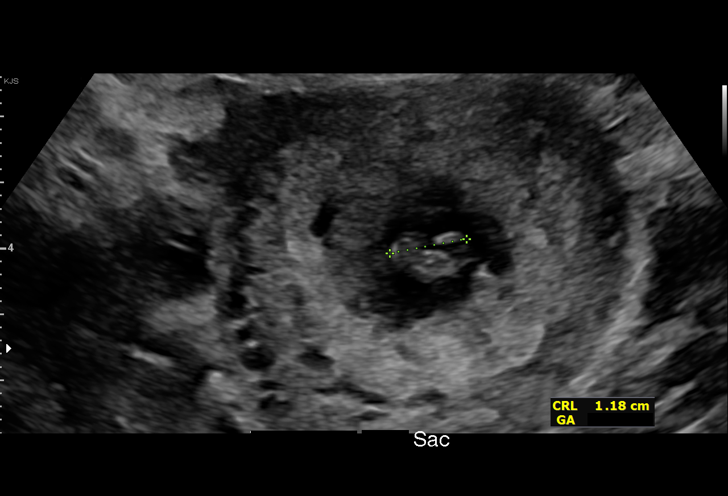
[im 22/40]
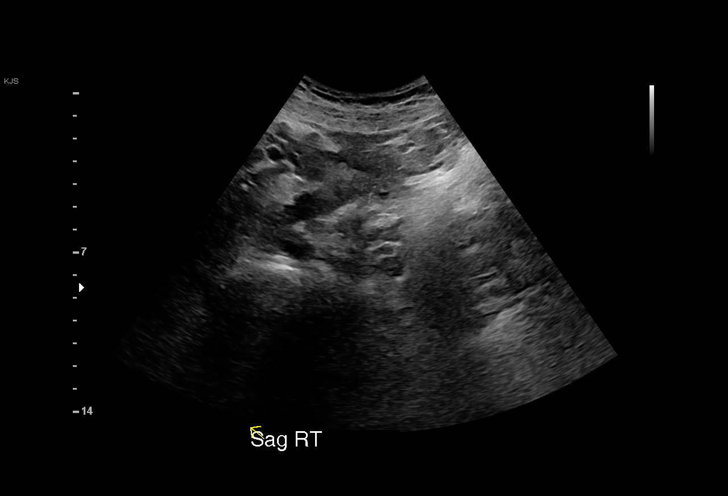
[im 25/40]
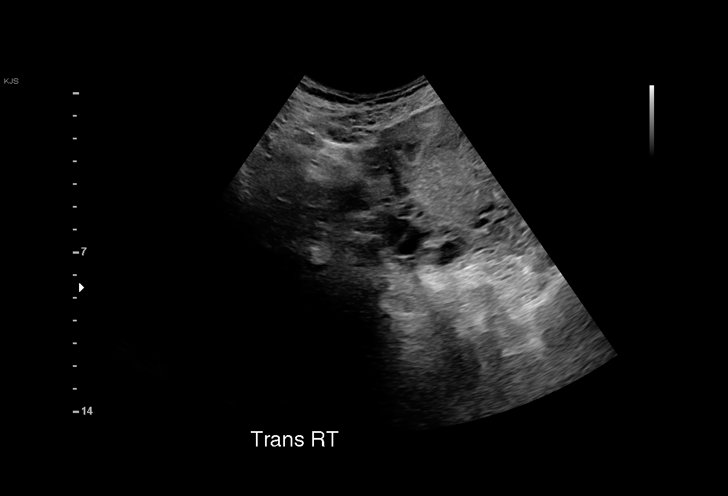
[im 28/40]
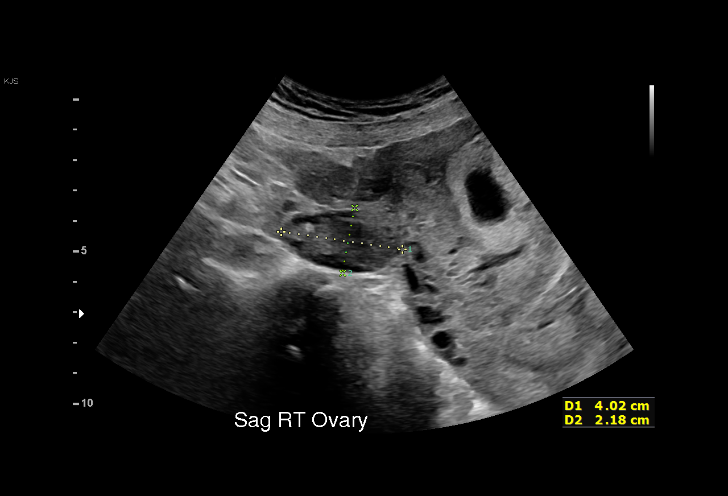
[im 31/40]
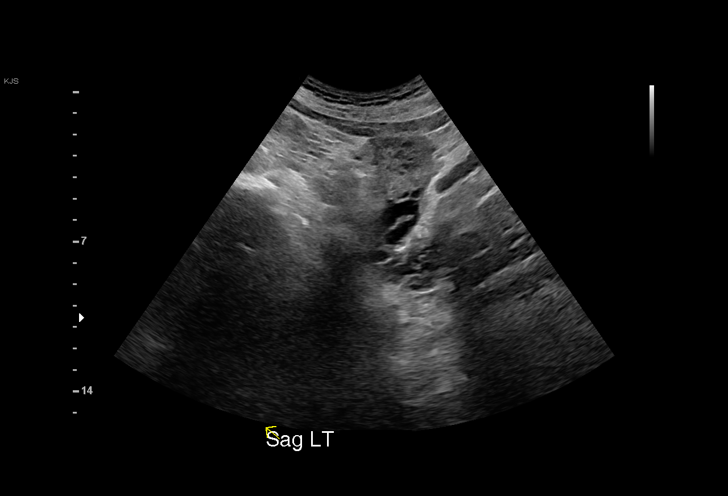
[im 34/40]
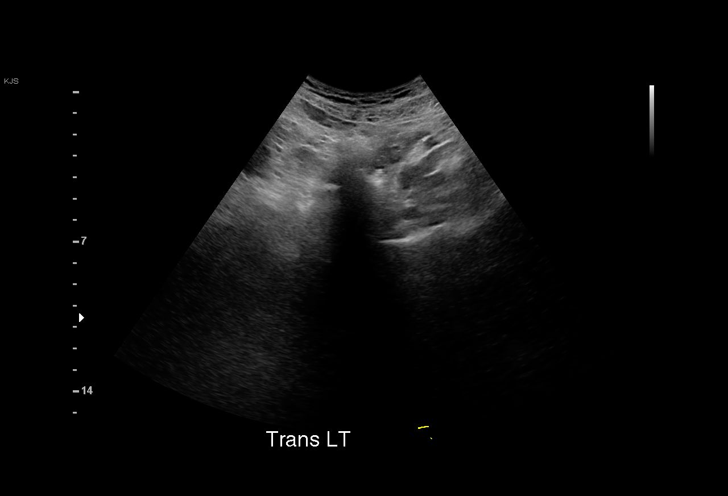
[im 37/40]
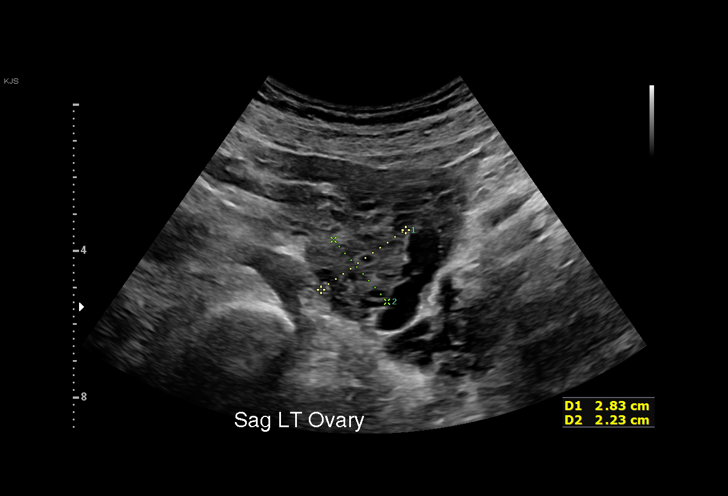
[im 40/40]
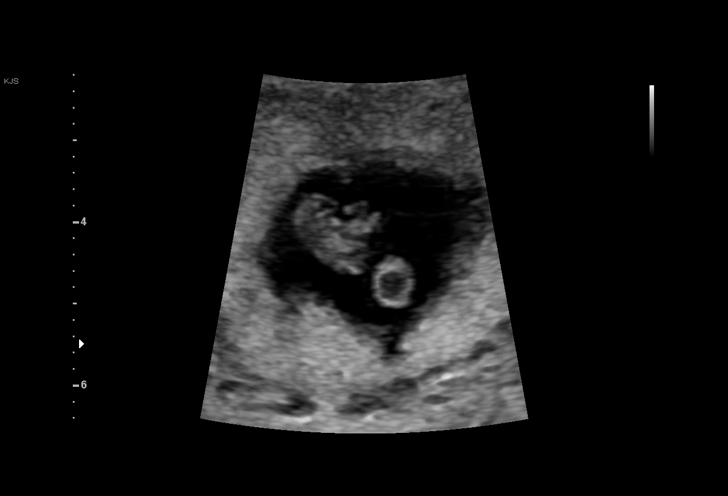

[15 of 28 positions shown; findings below may reference images not displayed]

FINDINGS: Intrauterine gestational sac: Single

Yolk sac:  Visualized.

Embryo:  Visualized.

Cardiac Activity: Visualized.

Heart Rate: 158 bpm

CRL:   11.6 mm   7 w 2 d                  US EDC: 04/11/2021

Subchorionic hemorrhage:  None visualized.

Maternal uterus/adnexae: Uterus and bilateral ovaries within normal
limits. No free fluid within the pelvis.
IMPRESSION: 1. Single live intrauterine gestation measuring 7 weeks 2 days by
crown-rump length.
2. Active embryonic heart tones at 158 beats per minute.

## 2023-06-03 LAB — CYTOLOGY - PAP
Comment: NEGATIVE
Comment: NEGATIVE
Comment: NEGATIVE
Diagnosis: HIGH — AB
HPV 16: NEGATIVE
HPV 18 / 45: NEGATIVE
High risk HPV: POSITIVE — AB

## 2023-06-04 ENCOUNTER — Encounter: Payer: Self-pay | Admitting: Obstetrics and Gynecology

## 2023-06-04 DIAGNOSIS — R87611 Atypical squamous cells cannot exclude high grade squamous intraepithelial lesion on cytologic smear of cervix (ASC-H): Secondary | ICD-10-CM | POA: Insufficient documentation

## 2023-06-05 ENCOUNTER — Encounter: Payer: Self-pay | Admitting: Obstetrics and Gynecology

## 2023-06-05 DIAGNOSIS — D649 Anemia, unspecified: Secondary | ICD-10-CM | POA: Insufficient documentation

## 2023-06-05 DIAGNOSIS — F419 Anxiety disorder, unspecified: Secondary | ICD-10-CM | POA: Insufficient documentation

## 2023-06-05 DIAGNOSIS — F32A Depression, unspecified: Secondary | ICD-10-CM | POA: Insufficient documentation

## 2023-06-05 DIAGNOSIS — F4310 Post-traumatic stress disorder, unspecified: Secondary | ICD-10-CM | POA: Insufficient documentation

## 2023-06-05 LAB — PANORAMA PRENATAL TEST FULL PANEL:PANORAMA TEST PLUS 5 ADDITIONAL MICRODELETIONS: FETAL FRACTION: 15.6
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
## 2023-06-11 LAB — HORIZON CUSTOM: REPORT SUMMARY: NEGATIVE

## 2023-06-13 ENCOUNTER — Encounter: Payer: Self-pay | Admitting: Obstetrics and Gynecology

## 2023-06-14 ENCOUNTER — Other Ambulatory Visit

## 2023-06-14 ENCOUNTER — Encounter: Admitting: Obstetrics & Gynecology

## 2023-06-15 ENCOUNTER — Other Ambulatory Visit

## 2023-06-15 ENCOUNTER — Encounter: Payer: Self-pay | Admitting: Obstetrics and Gynecology

## 2023-06-15 ENCOUNTER — Ambulatory Visit: Admitting: Obstetrics and Gynecology

## 2023-06-15 VITALS — BP 107/60 | HR 94 | Wt 154.0 lb

## 2023-06-15 DIAGNOSIS — Z3A31 31 weeks gestation of pregnancy: Secondary | ICD-10-CM

## 2023-06-15 DIAGNOSIS — Z2839 Other underimmunization status: Secondary | ICD-10-CM

## 2023-06-15 DIAGNOSIS — F419 Anxiety disorder, unspecified: Secondary | ICD-10-CM | POA: Diagnosis not present

## 2023-06-15 DIAGNOSIS — M25552 Pain in left hip: Secondary | ICD-10-CM

## 2023-06-15 DIAGNOSIS — O26899 Other specified pregnancy related conditions, unspecified trimester: Secondary | ICD-10-CM

## 2023-06-15 DIAGNOSIS — F32A Depression, unspecified: Secondary | ICD-10-CM

## 2023-06-15 DIAGNOSIS — Z3480 Encounter for supervision of other normal pregnancy, unspecified trimester: Secondary | ICD-10-CM | POA: Diagnosis not present

## 2023-06-15 DIAGNOSIS — O0930 Supervision of pregnancy with insufficient antenatal care, unspecified trimester: Secondary | ICD-10-CM

## 2023-06-15 DIAGNOSIS — Z6791 Unspecified blood type, Rh negative: Secondary | ICD-10-CM

## 2023-06-15 DIAGNOSIS — M25551 Pain in right hip: Secondary | ICD-10-CM

## 2023-06-15 DIAGNOSIS — O09899 Supervision of other high risk pregnancies, unspecified trimester: Secondary | ICD-10-CM

## 2023-06-15 DIAGNOSIS — F4310 Post-traumatic stress disorder, unspecified: Secondary | ICD-10-CM

## 2023-06-15 MED ORDER — CYCLOBENZAPRINE HCL 10 MG PO TABS: 10.0000 mg | ORAL_TABLET | Freq: Three times a day (TID) | ORAL | 1 refills | Status: DC | PRN

## 2023-06-15 NOTE — Progress Notes (Signed)
 Pt presents for ROB visit. Pt c/o hip pain. Had Tdap 02-16-23 considering Nexplanon

## 2023-06-15 NOTE — Progress Notes (Signed)
   PRENATAL VISIT NOTE  Subjective:  Amanda Sloan is a 28 y.o. G7P6006 at [redacted]w[redacted]d being seen today for ongoing prenatal care.  She is currently monitored for the following issues for this low-risk pregnancy and has Rh negative, maternal; Late prenatal care; Family history of sickle cell anemia; Family history of cleft lip; Supervision of other normal pregnancy, antepartum; Rubella non-immune status, antepartum; Trichomonal vaginitis in pregnancy; Atypical squamous cells cannot exclude high grade squamous intraepithelial lesion on cytologic smear of cervix (ASC-H); PTSD, anxiety, depression; and Anemia on their problem list.  Patient reports  reports hip pain, has tried ktape and maternity belt  .  Contractions: Not present. Vag. Bleeding: None.  Movement: Present. Denies leaking of fluid.   The following portions of the patient's history were reviewed and updated as appropriate: allergies, current medications, past family history, past medical history, past social history, past surgical history and problem list.   Objective:   Vitals:   06/15/23 0939  BP: 107/60  Pulse: 94  Weight: 154 lb (69.9 kg)    Fetal Status:     Movement: Present     General:  Alert, oriented and cooperative. Patient is in no acute distress.  Skin: Skin is warm and dry. No rash noted.   Cardiovascular: Normal heart rate noted  Respiratory: Normal respiratory effort, no problems with respiration noted  Abdomen: Soft, gravid, appropriate for gestational age.  Pain/Pressure: Present     Pelvic: Cervical exam deferred        Extremities: Normal range of motion.  Edema: None  Mental Status: Normal mood and affect. Normal behavior. Normal judgment and thought content.   Assessment and Plan:  Pregnancy: G7P6006 at [redacted]w[redacted]d 1. Supervision of other normal pregnancy, antepartum (Primary) BP and FHR normal Doing well, feeling regular movement  Schedule anatomy scan today  - US  MFM OB DETAIL +14 WK; Future  2. Rh  negative state in antepartum period Decline 29 wk rhogam, will   3. Anxiety 4. Depression, unspecified depression type 5. PTSD (post-traumatic stress disorder) Does CBT, declines medication at this time  6. Rubella non-immune status, antepartum Offer mmr pp   7. Late prenatal care   8. Bilateral hip pain Using ktape, maternity belts , had a fall  - cyclobenzaprine (FLEXERIL) 10 MG tablet; Take 1 tablet (10 mg total) by mouth every 8 (eight) hours as needed for muscle spasms.  Dispense: 30 tablet; Refill: 1 - Ambulatory referral to Physical Therapy   Preterm labor symptoms and general obstetric precautions including but not limited to vaginal bleeding, contractions, leaking of fluid and fetal movement were reviewed in detail with the patient. Please refer to After Visit Summary for other counseling recommendations.   Return in two weeks for schedule with midwife, needs to be scheduled for colpo, anatomy scheduled  Future Appointments  Date Time Provider Department Center  06/27/2023  8:35 AM Izell Marsh, MD [facility] None  06/29/2023  3:50 PM Loetta Ringer, CNM [facility] None  07/15/2023  7:00 AM [facility] PROVIDER 1 [facility]  07/15/2023  7:30 AM [facility] US2 [facility]       Susi Eric, FNP

## 2023-06-16 ENCOUNTER — Ambulatory Visit: Attending: Obstetrics and Gynecology | Admitting: Physical Therapy

## 2023-06-16 ENCOUNTER — Encounter: Payer: Self-pay | Admitting: Physical Therapy

## 2023-06-16 DIAGNOSIS — M6281 Muscle weakness (generalized): Secondary | ICD-10-CM | POA: Insufficient documentation

## 2023-06-16 DIAGNOSIS — M25552 Pain in left hip: Secondary | ICD-10-CM | POA: Insufficient documentation

## 2023-06-16 DIAGNOSIS — M25551 Pain in right hip: Secondary | ICD-10-CM | POA: Diagnosis present

## 2023-06-16 DIAGNOSIS — R262 Difficulty in walking, not elsewhere classified: Secondary | ICD-10-CM | POA: Diagnosis present

## 2023-06-16 LAB — CBC
Hematocrit: 31.2 % — ABNORMAL LOW (ref 34.0–46.6)
Hemoglobin: 10.2 g/dL — ABNORMAL LOW (ref 11.1–15.9)
MCH: 30 pg (ref 26.6–33.0)
MCHC: 32.7 g/dL (ref 31.5–35.7)
MCV: 92 fL (ref 79–97)
Platelets: 166 10*3/uL (ref 150–450)
RBC: 3.4 x10E6/uL — ABNORMAL LOW (ref 3.77–5.28)
RDW: 11.6 % — ABNORMAL LOW (ref 11.7–15.4)
WBC: 6.7 10*3/uL (ref 3.4–10.8)

## 2023-06-16 LAB — HIV ANTIBODY (ROUTINE TESTING W REFLEX): HIV Screen 4th Generation wRfx: NONREACTIVE

## 2023-06-16 LAB — GLUCOSE TOLERANCE, 2 HOURS W/ 1HR
Glucose, 1 hour: 127 mg/dL (ref 70–179)
Glucose, 2 hour: 108 mg/dL (ref 70–152)
Glucose, Fasting: 82 mg/dL (ref 70–91)

## 2023-06-16 LAB — RPR: RPR Ser Ql: NONREACTIVE

## 2023-06-16 NOTE — Therapy (Signed)
 OUTPATIENT PHYSICAL THERAPY LOWER EXTREMITY EVALUATION   Patient Name: Amanda Sloan MRN: [id number] DOB:[birth year], 28 y.o., female Today's Date: 06/16/2023  END OF SESSION:  PT End of Session - 06/16/23 1241     Visit Number 1    Date for PT Re-Evaluation 08/11/23    Authorization Type [employer] will submit    PT Start Time 1235    PT Stop Time 1315    PT Time Calculation (min) 40 min    Activity Tolerance Patient tolerated treatment well             Past Medical History:  Diagnosis Date   Anxiety    Depression    PTSD (post-traumatic stress disorder)    Past Surgical History:  Procedure Laterality Date   NO PAST SURGERIES     Patient Active Problem List   Diagnosis Date Noted   PTSD, anxiety, depression 06/05/2023   Anemia 06/05/2023   Atypical squamous cells cannot exclude high grade squamous intraepithelial lesion on cytologic smear of cervix (ASC-H) 06/04/2023   Rubella non-immune status, antepartum 06/01/2023   Trichomonal vaginitis in pregnancy 06/01/2023   Supervision of other normal pregnancy, antepartum 05/30/2023   Rh negative, maternal 11/17/2015   Late prenatal care 11/17/2015   Family history of sickle cell anemia 11/17/2015   Family history of cleft lip 11/17/2015    PCP: none  REFERRING PROVIDER: Susi Eric FNP  REFERRING DIAG: M25.551, M25.552 bil hip pain  THERAPY DIAG:  Bil hip pain; weaknesss  Rationale for Evaluation and Treatment: Rehabilitation  ONSET DATE: February  SUBJECTIVE:   SUBJECTIVE STATEMENT: [redacted] weeks pregnant with 7th child; had a fall from a high bed in February landing onto left side since then the pain has worsened left > right SI joint  PERTINENT HISTORY: Anxiety PAIN:  Are you having pain? Yes NPRS scale: 1-2/10, can be an 8-9 Pain location: left > right SI joint Pain orientation: Bilateral  PAIN TYPE: throbbing Pain description: constant  Aggravating factors: walking fast, sitting erect,  getting out of high bed, driving a SUV, picking up toddlers Relieving factors: sleep with pillow b/w legs; heat  PRECAUTIONS: None   WEIGHT BEARING RESTRICTIONS: No  FALLS:  Has patient fallen in last 6 months? Yes. Number of falls 1 fall onto left side   LIVING ENVIRONMENT: Lives with: lives with their family Lives in: House/apartment  PLOF: Independent  PATIENT GOALS: pain relief; able to pick up her toddler; be able to have a natural child birth (water)   OBJECTIVE:  Note: Objective measures were completed at Evaluation unless otherwise noted.   PATIENT SURVEYS:  LEFS 15/80  COGNITION: Overall cognitive status: Within functional limits for tasks assessed      PALPATION: Tender left PSIS, SI joint, medial gluteals left  Painful lumbar extension and with trunk sidebending  LOWER EXTREMITY ROM: WFLS  LOWER EXTREMITY MMT: decreased left gluteal activation 4-/5 strength grade with pelvic drop noted with SLS  LOWER EXTREMITY SPECIAL TESTS:  Increased pain with SLS 5-8 sec right/left Able to squat 1/2 way, painful on the rise  GAIT:  decreased gait speed  TREATMENT DATE: 06/16/23 evaluation  KT taping for SI joint: pt tapes her abdomen  Initial HEP   PATIENT EDUCATION:  Education details: Educated patient on anatomy and physiology of current symptoms, prognosis, plan of care as well as initial self care strategies to promote recovery Person educated: Patient Education method: Explanation Education comprehension: verbalized understanding  HOME EXERCISE PROGRAM: Access Code: [email or web address] URL: [email or web address] Date: 06/16/2023 Prepared by: Darien Eden  Exercises - Quadruped Rocking Slow  - 1 x daily - 7 x weekly - 1 sets - 10 reps - Cat Cow  - 1 x daily - 7 x weekly - 3 sets - 10 reps - Sidelying Reverse Clamshell  - 1 x daily - 7 x weekly - 1 sets  - 10 reps - Tail Wag  - 1 x daily - 7 x weekly - 1 sets - 10 reps  ASSESSMENT:  CLINICAL IMPRESSION: Patient is a 28 y.o. female who was seen today for physical therapy evaluation and treatment for bilateral hip pain during pregnancy and following a fall onto her left side in February.  Pain located in left > right SI joint with tenderness over joint line and medial gluteals.  Pain is severe at times and is aggravated with walking quickly, getting out of bed, and picking up her toddlers.  Decreased left gluteal muscle strength noted with pelvic drop with single leg stance.  She would benefit from PT to address these impairments affecting her ADLs during the remainder of her pregnancy and ability to take care of her 6 children.   OBJECTIVE IMPAIRMENTS: decreased activity tolerance, difficulty walking, decreased strength, impaired perceived functional ability, and pain.   ACTIVITY LIMITATIONS: carrying, lifting, sitting, sleeping, and locomotion level  PARTICIPATION LIMITATIONS: meal prep, cleaning, laundry, and driving  PERSONAL FACTORS: 1 comorbidity: 3rd trimester pregnancy  are also affecting patient's functional outcome.   REHAB POTENTIAL: Good  CLINICAL DECISION MAKING: Stable/uncomplicated  EVALUATION COMPLEXITY: Low   GOALS: Goals reviewed with patient? Yes  SHORT TERM GOALS: Target date: 07/14/2023   The patient will demonstrate knowledge of basic self care strategies including KT taping and exercises to promote healing  Baseline: Goal status: INITIAL  2.  The patient will have improved hip strength to at least 4/5 needed for standing, walking longer distances and lifting her toddlers Baseline:  Goal status: INITIAL  3.  The patient will be able to walk 5-7 min with pain < 5/10 Baseline:  Goal status: INITIAL        LONG TERM GOALS: Target date: 08/11/2023   The patient will be independent in a safe self progression of a home exercise program to promote further  recovery of function  Baseline:  Goal status: INITIAL  2.  The patient will have improved hip strength to at least 4+/5 needed for standing, walking longer distances and lifting her toddlers Baseline:  Goal status: INITIAL  3.  The patient will report a 40% reduction in hip pain with walking, sitting and driving Baseline:  Goal status: INITIAL  4.  LEFS 30/80 indicating improved function with less pain Baseline:  Goal status: INITIAL     PLAN:  PT FREQUENCY: 1x/week  PT DURATION: 8 weeks  PLANNED INTERVENTIONS: 97164- PT Re-evaluation, 97110-Therapeutic exercises, 97530- Therapeutic activity, 97112- Neuromuscular re-education, 97535- Self Care, 54098- Manual therapy, Patient/Family education, Taping, Dry Needling, Joint mobilization, Spinal mobilization, Cryotherapy, and Moist heat  PLAN FOR NEXT SESSION: seated green ball ex's;  left gluteal strengthening; KT tape (pt has at home)  Darien Eden, PT 06/16/23 9:06 PM Phone: [phone] Fax: [phone]

## 2023-06-27 ENCOUNTER — Ambulatory Visit: Admitting: Obstetrics and Gynecology

## 2023-06-27 VITALS — BP 102/69 | HR 89 | Wt 156.1 lb

## 2023-06-27 DIAGNOSIS — R87611 Atypical squamous cells cannot exclude high grade squamous intraepithelial lesion on cytologic smear of cervix (ASC-H): Secondary | ICD-10-CM

## 2023-06-27 DIAGNOSIS — R87810 Cervical high risk human papillomavirus (HPV) DNA test positive: Secondary | ICD-10-CM | POA: Diagnosis not present

## 2023-06-27 NOTE — Progress Notes (Signed)
 Pt presents for colpo. Pt has no questions or concerns at this time.

## 2023-06-27 NOTE — Progress Notes (Signed)
    GYNECOLOGY OFFICE COLPOSCOPY PROCEDURE NOTE  28 y.o. [id number] at [redacted]w[redacted]d here for colposcopy for ASC cannot exclude high grade lesion (ASCH)/HRHPV+ pap smear on 05/31/23.   Pregnancy test:  not indicated Gardasil: Not discussed (pregnant)  Informed consent and review of risks, benefit and alternatives performed. Written consent given.   Speculum inserted into patient's vagina assuring full view of cervix and vaginal walls. 3 swabs of vinegar solution applied to the cervix and vaginal walls and colposcope was used to observe both the cervix and vaginal walls.   Colposcopy adequate? Yes Possible thicker AWE with coarse mosaicism vs metaplastic changes at 3 o'clock, biopsies deferred.   Speculum removed.  Pt tolerated well with minimal pain and bleeding.   Patient was given post procedure instructions.  Will follow up pathology and manage accordingly; patient will be contacted with results and recommendations.  Routine preventative health maintenance measures emphasized.  Loralyn Rochester, MD Obstetrician & Gynecologist, [facility] for [employer], [facility]

## 2023-06-29 ENCOUNTER — Ambulatory Visit

## 2023-06-29 VITALS — BP 107/66 | HR 81 | Wt 153.8 lb

## 2023-06-29 DIAGNOSIS — Z3480 Encounter for supervision of other normal pregnancy, unspecified trimester: Secondary | ICD-10-CM

## 2023-06-29 DIAGNOSIS — Z3009 Encounter for other general counseling and advice on contraception: Secondary | ICD-10-CM | POA: Diagnosis not present

## 2023-06-29 DIAGNOSIS — Z3A33 33 weeks gestation of pregnancy: Secondary | ICD-10-CM

## 2023-06-29 NOTE — Progress Notes (Signed)
 Pt presents for ROB visit. No concerns

## 2023-06-29 NOTE — Progress Notes (Signed)
   LOW-RISK PREGNANCY OFFICE VISIT  Patient name: Amanda Sloan MRN [id number]  Date of birth: 05-31-1995 Chief Complaint:   Routine Prenatal Visit  Subjective:   Amanda Sloan is a 28 y.o. G41P6006 female at [redacted]w[redacted]d with an Estimated Date of Delivery: 08/12/23 being seen today for ongoing management of a high-risk pregnancy aeb has Rh negative, maternal; Late prenatal care; Family history of sickle cell anemia; Family history of cleft lip; Supervision of other normal pregnancy, antepartum; Rubella non-immune status, antepartum; Trichomonal vaginitis in pregnancy; Atypical squamous cells cannot exclude high grade squamous intraepithelial lesion on cytologic smear of cervix (ASC-H); PTSD, anxiety, depression; and Anemia on their problem list.  Patient presents today, alone, with no complaints.  Patient endorses fetal movement. Patient denies abdominal cramping or contractions, but reports occasional BH.  Patient denies vaginal concerns including abnormal discharge, leaking of fluid, and bleeding. No issues with urination, constipation, or diarrhea.    Contractions: Irritability. Vag. Bleeding: None.  Movement: Present.  Reviewed past medical,surgical, social, obstetrical and family history as well as problem list, medications and allergies.  Objective   Vitals:   06/29/23 1551  BP: 107/66  Pulse: 81  Weight: 153 lb 12.8 oz (69.8 kg)  Body mass index is 24.09 kg/m.  Total Weight Gain:23 lb 12.8 oz (10.8 kg)         Physical Examination:   General appearance: Well appearing, and in no distress  Mental status: Alert, oriented to person, place, and time  Skin: Warm & dry  Cardiovascular: Normal heart rate noted  Respiratory: Normal respiratory effort, no distress  Abdomen: Soft, gravid, nontender, AGA with Fundal Height: 33 cm  Pelvic: Cervical exam deferred           Extremities: Edema: None  Fetal Status: Fetal Heart Rate (bpm): 145  Movement: Present   No results found for this or  any previous visit (from the past 24 hours).  Assessment & Plan:  High-risk pregnancy of a 28 y.o., G7P6006 at [redacted]w[redacted]d with an Estimated Date of Delivery: 08/12/23   1. Supervision of other normal pregnancy, antepartum -Anticipatory guidance for upcoming appts. -Patient to schedule next appt in 2-3 weeks for an in-person visit.   2. [redacted] weeks gestation of pregnancy -Doing well.  -Reviewed next appt and cultures to be collected.  -Educated on GBS bacteria including what it is, why we test, and how and when we treat if needed. -Discussed collection method.  -Informed that GC/CT will also be collected.  -Discussed option of cervical exam, at visit, if desired.     3. Birth control counseling -Reviewed birth control methods by tiered approach including risks, benefits, and possible side effects.  -Discussed possible changes in bleeding pattern that could result in amenorrhea, intermittent spotting, monthly menses, or menorrhagia.  -Given information in AVS and encouraged to go onto bedsider.org for further information and comparisons.        Meds: No orders of the defined types were placed in this encounter.  Labs/procedures today:  Lab Orders  No laboratory test(s) ordered today     Reviewed: Preterm labor symptoms and general obstetric precautions including but not limited to vaginal bleeding, contractions, leaking of fluid and fetal movement were reviewed in detail with the patient.  All questions were answered.  Follow-up: Return in about 3 weeks (around 07/20/2023) for HROB with GBS.  No orders of the defined types were placed in this encounter.  Kraig Peru MSN, CNM 06/29/2023

## 2023-06-30 ENCOUNTER — Ambulatory Visit: Admitting: Physical Therapy

## 2023-06-30 DIAGNOSIS — M6281 Muscle weakness (generalized): Secondary | ICD-10-CM

## 2023-06-30 DIAGNOSIS — M25551 Pain in right hip: Secondary | ICD-10-CM | POA: Diagnosis not present

## 2023-06-30 DIAGNOSIS — R262 Difficulty in walking, not elsewhere classified: Secondary | ICD-10-CM

## 2023-06-30 NOTE — Therapy (Signed)
 OUTPATIENT PHYSICAL THERAPY LOWER EXTREMITY PROGRESS NOTE   Patient Name: Amanda Sloan MRN: [id number] DOB:[birth year], 28 y.o., female Today's Date: 06/30/2023  END OF SESSION:  PT End of Session - 06/30/23 1612     Visit Number 2    Number of Visits 7    Date for PT Re-Evaluation 08/11/23    Authorization Type [employer] 7 visits 5/1-6/29    PT Start Time 1615    PT Stop Time 1654    PT Time Calculation (min) 39 min    Activity Tolerance Patient tolerated treatment well             Past Medical History:  Diagnosis Date   Anxiety    Depression    PTSD (post-traumatic stress disorder)    Past Surgical History:  Procedure Laterality Date   NO PAST SURGERIES     Patient Active Problem List   Diagnosis Date Noted   PTSD, anxiety, depression 06/05/2023   Anemia 06/05/2023   Atypical squamous cells cannot exclude high grade squamous intraepithelial lesion on cytologic smear of cervix (ASC-H) 06/04/2023   Rubella non-immune status, antepartum 06/01/2023   Trichomonal vaginitis in pregnancy 06/01/2023   Supervision of other normal pregnancy, antepartum 05/30/2023   Rh negative, maternal 11/17/2015   Late prenatal care 11/17/2015   Family history of sickle cell anemia 11/17/2015   Family history of cleft lip 11/17/2015    PCP: none  REFERRING PROVIDER: Susi Eric FNP  REFERRING DIAG: M25.551, M25.552 bil hip pain  THERAPY DIAG:  Bil hip pain; weaknesss  Rationale for Evaluation and Treatment: Rehabilitation  ONSET DATE: February  SUBJECTIVE:   SUBJECTIVE STATEMENT: Resting it has helped.  In/out of cars is aggravating.  Haven't tried the Ktape on my back yet.  I need to have my husband blow up my ball.   [redacted] weeks pregnant with 7th child; had a fall from a high bed in February landing onto left side since then the pain has worsened left > right SI joint  PERTINENT HISTORY: Anxiety PAIN:  Are you having pain? Yes NPRS scale: 3-4 Pain location: left  > right SI joint Pain orientation: Bilateral  PAIN TYPE: throbbing Pain description: constant  Aggravating factors: walking fast, sitting erect, getting out of high bed, driving a SUV, picking up toddlers Relieving factors: sleep with pillow b/w legs; heat  PRECAUTIONS: None   WEIGHT BEARING RESTRICTIONS: No  FALLS:  Has patient fallen in last 6 months? Yes. Number of falls 1 fall onto left side   LIVING ENVIRONMENT: Lives with: lives with their family Lives in: House/apartment  PLOF: Independent  PATIENT GOALS: pain relief; able to pick up her toddler; be able to have a natural child birth (water)   OBJECTIVE:  Note: Objective measures were completed at Evaluation unless otherwise noted.   PATIENT SURVEYS:  LEFS 15/80  COGNITION: Overall cognitive status: Within functional limits for tasks assessed      PALPATION: Tender left PSIS, SI joint, medial gluteals left  Painful lumbar extension and with trunk sidebending  LOWER EXTREMITY ROM: WFLS  LOWER EXTREMITY MMT: decreased left gluteal activation 4-/5 strength grade with pelvic drop noted with SLS  LOWER EXTREMITY SPECIAL TESTS:  Increased pain with SLS 5-8 sec right/left Able to squat 1/2 way, painful on the rise  GAIT:  decreased gait speed          TREATMENT DATE: 06/20/23    Patient education: SI joint anatomy and the effects of pregnancy on mobility of this  joint Sitting on blue ball pelvic mobility Patient education on "opening" techniques of anterior hips Sidelying muscle energy: left manually resisted hip abduction/external rotation 5 sec hold 10x KT taping:  1 strip across SI joint (horizontal); 2 vertical strips from SI joint lines superior to low thoracic                                                                                        TREATMENT DATE: 06/16/23 evaluation  KT taping for SI joint: pt tapes her abdomen  Initial HEP   PATIENT EDUCATION:  Education details: Educated patient on  anatomy and physiology of current symptoms, prognosis, plan of care as well as initial self care strategies to promote recovery Person educated: Patient Education method: Explanation Education comprehension: verbalized understanding  HOME EXERCISE PROGRAM: Access Code: [email or web address] URL: [email or web address] Date: 06/16/2023 Prepared by: Darien Eden  Exercises - Quadruped Rocking Slow  - 1 x daily - 7 x weekly - 1 sets - 10 reps - Cat Cow  - 1 x daily - 7 x weekly - 3 sets - 10 reps - Sidelying Reverse Clamshell  - 1 x daily - 7 x weekly - 1 sets - 10 reps - Tail Wag  - 1 x daily - 7 x weekly - 1 sets - 10 reps  ASSESSMENT:  CLINICAL IMPRESSION: Decreased symptom irritability the last couple of days with rest.  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  Treatment emphasis on patient education to manage this in her 3rd trimester.  Instructed in SI correction muscle energy technique.  Good initial response with KT taping for SI support.      EVAL: Patient is a 28 y.o. female who was seen today for physical therapy evaluation and treatment for bilateral hip pain during pregnancy and following a fall onto her left side in February.  Pain located in left > right SI joint with tenderness over joint line and medial gluteals.  Pain is severe at times and is aggravated with walking quickly, getting out of bed, and picking up her toddlers.  Decreased left gluteal muscle strength noted with pelvic drop with single leg stance.  She would benefit from PT to address these impairments affecting her ADLs during the remainder of her pregnancy and ability to take care of her 6 children.   OBJECTIVE IMPAIRMENTS: decreased activity tolerance, difficulty walking, decreased strength, impaired perceived functional ability, and pain.   ACTIVITY LIMITATIONS: carrying, lifting, sitting, sleeping, and locomotion level  PARTICIPATION LIMITATIONS: meal prep, cleaning, laundry, and  driving  PERSONAL FACTORS: 1 comorbidity: 3rd trimester pregnancy are also affecting patient's functional outcome.   REHAB POTENTIAL: Good  CLINICAL DECISION MAKING: Stable/uncomplicated  EVALUATION COMPLEXITY: Low   GOALS: Goals reviewed with patient? Yes  SHORT TERM GOALS: Target date: 07/14/2023   The patient will demonstrate knowledge of basic self care strategies including KT taping and exercises to promote healing  Baseline: Goal status: INITIAL  2.  The patient will have improved hip strength to at least 4/5 needed for standing, walking longer distances and lifting her toddlers Baseline:  Goal status: INITIAL  3.  The patient will be able  to walk 5-7 min with pain < 5/10 Baseline:  Goal status: INITIAL        LONG TERM GOALS: Target date: 08/11/2023   The patient will be independent in a safe self progression of a home exercise program to promote further recovery of function  Baseline:  Goal status: INITIAL  2.  The patient will have improved hip strength to at least 4+/5 needed for standing, walking longer distances and lifting her toddlers Baseline:  Goal status: INITIAL  3.  The patient will report a 40% reduction in hip pain with walking, sitting and driving Baseline:  Goal status: INITIAL  4.  LEFS 30/80 indicating improved function with less pain Baseline:  Goal status: INITIAL     PLAN:  PT FREQUENCY: 1x/week  PT DURATION: 8 weeks  PLANNED INTERVENTIONS: 97164- PT Re-evaluation, 97110-Therapeutic exercises, 97530- Therapeutic activity, 97112- Neuromuscular re-education, 97535- Self Care, 86578- Manual therapy, Patient/Family education, Taping, Dry Needling, Joint mobilization, Spinal mobilization, Cryotherapy, and Moist heat  PLAN FOR NEXT SESSION: seated green ball ex's;  left gluteal strengthening; KT tape (pt has at home)  Darien Eden, PT 06/30/23 5:08 PM Phone: [phone] Fax: [phone]

## 2023-07-07 ENCOUNTER — Ambulatory Visit: Admitting: Physical Therapy

## 2023-07-07 DIAGNOSIS — M6281 Muscle weakness (generalized): Secondary | ICD-10-CM

## 2023-07-07 DIAGNOSIS — M25551 Pain in right hip: Secondary | ICD-10-CM

## 2023-07-07 DIAGNOSIS — R262 Difficulty in walking, not elsewhere classified: Secondary | ICD-10-CM

## 2023-07-07 NOTE — Therapy (Signed)
 OUTPATIENT PHYSICAL THERAPY LOWER EXTREMITY PROGRESS NOTE   Patient Name: Amanda Sloan MRN: [id number] DOB:[birth year], 28 y.o., female Today's Date: 07/07/2023  END OF SESSION:  PT End of Session - 07/07/23 1609     Visit Number 3    Number of Visits 7    Date for PT Re-Evaluation 08/11/23    Authorization Type [employer] 7 visits 5/1-6/29    PT Start Time 1610    PT Stop Time 1648    PT Time Calculation (min) 38 min    Activity Tolerance Patient tolerated treatment well             Past Medical History:  Diagnosis Date   Anxiety    Depression    PTSD (post-traumatic stress disorder)    Past Surgical History:  Procedure Laterality Date   NO PAST SURGERIES     Patient Active Problem List   Diagnosis Date Noted   PTSD, anxiety, depression 06/05/2023   Anemia 06/05/2023   Atypical squamous cells cannot exclude high grade squamous intraepithelial lesion on cytologic smear of cervix (ASC-H) 06/04/2023   Rubella non-immune status, antepartum 06/01/2023   Trichomonal vaginitis in pregnancy 06/01/2023   Supervision of other normal pregnancy, antepartum 05/30/2023   Rh negative, maternal 11/17/2015   Late prenatal care 11/17/2015   Family history of sickle cell anemia 11/17/2015   Family history of cleft lip 11/17/2015    PCP: none  REFERRING PROVIDER: Susi Eric FNP  REFERRING DIAG: M25.551, M25.552 bil hip pain  THERAPY DIAG:  Bil hip pain; weaknesss  Rationale for Evaluation and Treatment: Rehabilitation  ONSET DATE: February  SUBJECTIVE:   SUBJECTIVE STATEMENT: KT relieved pain but now redness and itchy (skin sensitivity). No pain while tape was on.   [redacted] weeks pregnant with 7th child; had a fall from a high bed in February landing onto left side since then the pain has worsened left > right SI joint  PERTINENT HISTORY: Anxiety PAIN:  Are you having pain? Yes NPRS scale: 3 Pain location: left > right SI joint Pain orientation: Bilateral  PAIN  TYPE: throbbing Pain description: constant  Aggravating factors: walking fast, sitting erect, getting out of high bed, driving a SUV, picking up toddlers Relieving factors: sleep with pillow b/w legs; heat  PRECAUTIONS: None   WEIGHT BEARING RESTRICTIONS: No  FALLS:  Has patient fallen in last 6 months? Yes. Number of falls 1 fall onto left side   LIVING ENVIRONMENT: Lives with: lives with their family Lives in: House/apartment  PLOF: Independent  PATIENT GOALS: pain relief; able to pick up her toddler; be able to have a natural child birth (water)   OBJECTIVE:  Note: Objective measures were completed at Evaluation unless otherwise noted.   PATIENT SURVEYS:  LEFS 15/80  COGNITION: Overall cognitive status: Within functional limits for tasks assessed      PALPATION: Tender left PSIS, SI joint, medial gluteals left  Painful lumbar extension and with trunk sidebending  LOWER EXTREMITY ROM: WFLS  LOWER EXTREMITY MMT: decreased left gluteal activation 4-/5 strength grade with pelvic drop noted with SLS  LOWER EXTREMITY SPECIAL TESTS:  Increased pain with SLS 5-8 sec right/left Able to squat 1/2 way, painful on the rise  GAIT:  decreased gait speed  TREATMENT DATE: 07/07/23  Patient has a red skin reaction from KT tape, discussed anti-itch strategies  and after healed discussed a skin barrier (milk of magnesium) Serola and SI.LOC options for sacral support Sitting with lumbar roll support; avoid sacral sitting  Seated with feet on foot stool with ball squeeze 5 sec hold Seated with feet on stool with black band hip abduction isometric 5 sec hold Sit to stand with ball squeeze 5x  Sit to stand with black band abduction isometric 2x   TREATMENT DATE: 06/20/23    Patient education: SI joint anatomy and the effects of pregnancy on mobility of this joint Sitting on blue ball pelvic mobility Patient education on "opening" techniques of anterior hips Sidelying muscle  energy: left manually resisted hip abduction/external rotation 5 sec hold 10x KT taping:  1 strip across SI joint (horizontal); 2 vertical strips from SI joint lines superior to low thoracic                                                                                        TREATMENT DATE: 06/16/23 evaluation  KT taping for SI joint: pt tapes her abdomen  Initial HEP   PATIENT EDUCATION:  Education details: Educated patient on anatomy and physiology of current symptoms, prognosis, plan of care as well as initial self care strategies to promote recovery Person educated: Patient Education method: Explanation Education comprehension: verbalized understanding  HOME EXERCISE PROGRAM: Access Code: [email or web address] URL: [email or web address] Date: 06/16/2023 Prepared by: Darien Eden  Exercises - Quadruped Rocking Slow  - 1 x daily - 7 x weekly - 1 sets - 10 reps - Cat Cow  - 1 x daily - 7 x weekly - 3 sets - 10 reps - Sidelying Reverse Clamshell  - 1 x daily - 7 x weekly - 1 sets - 10 reps - Tail Wag  - 1 x daily - 7 x weekly - 1 sets - 10 reps  ASSESSMENT:  CLINICAL IMPRESSION: Reeya responded well to KT tape for 4 days which provided her with excellent pain relief.  Unfortunately after taking the tape off she noticed itching, redness and little bumps.  The pain returned but is less than it was.  We discussed SI support belts but she is so close to her due date she is unsure if this would be cost effective. Treatment focus on muscle energy for SI stabilization.      EVAL: Patient is a 28 y.o. female who was seen today for physical therapy evaluation and treatment for bilateral hip pain during pregnancy and following a fall onto her left side in February.  Pain located in left > right SI joint with tenderness over joint line and medial gluteals.  Pain is severe at times and is aggravated with walking quickly, getting out of bed, and picking up her toddlers.  Decreased left  gluteal muscle strength noted with pelvic drop with single leg stance.  She would benefit from PT to address these impairments affecting her ADLs during the remainder of her pregnancy and ability to take care of her 6 children.   OBJECTIVE IMPAIRMENTS: decreased activity tolerance, difficulty walking, decreased strength, impaired perceived functional ability, and pain.   ACTIVITY LIMITATIONS: carrying, lifting, sitting, sleeping, and locomotion level  PARTICIPATION LIMITATIONS: meal prep, cleaning, laundry, and driving  PERSONAL FACTORS: 1 comorbidity: 3rd trimester pregnancy are also affecting patient's functional outcome.   REHAB POTENTIAL:  Good  CLINICAL DECISION MAKING: Stable/uncomplicated  EVALUATION COMPLEXITY: Low   GOALS: Goals reviewed with patient? Yes  SHORT TERM GOALS: Target date: 07/14/2023   The patient will demonstrate knowledge of basic self care strategies including KT taping and exercises to promote healing  Baseline: Goal status: INITIAL  2.  The patient will have improved hip strength to at least 4/5 needed for standing, walking longer distances and lifting her toddlers Baseline:  Goal status: INITIAL  3.  The patient will be able to walk 5-7 min with pain < 5/10 Baseline:  Goal status: INITIAL        LONG TERM GOALS: Target date: 08/11/2023   The patient will be independent in a safe self progression of a home exercise program to promote further recovery of function  Baseline:  Goal status: INITIAL  2.  The patient will have improved hip strength to at least 4+/5 needed for standing, walking longer distances and lifting her toddlers Baseline:  Goal status: INITIAL  3.  The patient will report a 40% reduction in hip pain with walking, sitting and driving Baseline:  Goal status: INITIAL  4.  LEFS 30/80 indicating improved function with less pain Baseline:  Goal status: INITIAL     PLAN:  PT FREQUENCY: 1x/week  PT DURATION: 8  weeks  PLANNED INTERVENTIONS: 97164- PT Re-evaluation, 97110-Therapeutic exercises, 97530- Therapeutic activity, 97112- Neuromuscular re-education, 97535- Self Care, 16109- Manual therapy, Patient/Family education, Taping, Dry Needling, Joint mobilization, Spinal mobilization, Cryotherapy, and Moist heat  PLAN FOR NEXT SESSION: SI stabilization strategies;  seated green ball ex's;  left gluteal strengthening  Darien Eden, PT 07/07/23 5:18 PM Phone: [phone] Fax: [phone]

## 2023-07-14 ENCOUNTER — Ambulatory Visit: Admitting: Physical Therapy

## 2023-07-15 ENCOUNTER — Ambulatory Visit: Admitting: Obstetrics and Gynecology

## 2023-07-15 ENCOUNTER — Ambulatory Visit: Attending: Obstetrics and Gynecology

## 2023-07-15 VITALS — BP 108/57 | HR 98

## 2023-07-15 DIAGNOSIS — Z6791 Unspecified blood type, Rh negative: Secondary | ICD-10-CM | POA: Diagnosis not present

## 2023-07-15 DIAGNOSIS — O0943 Supervision of pregnancy with grand multiparity, third trimester: Secondary | ICD-10-CM

## 2023-07-15 DIAGNOSIS — Z36 Encounter for antenatal screening for chromosomal anomalies: Secondary | ICD-10-CM | POA: Insufficient documentation

## 2023-07-15 DIAGNOSIS — Z3A35 35 weeks gestation of pregnancy: Secondary | ICD-10-CM | POA: Diagnosis present

## 2023-07-15 DIAGNOSIS — O26893 Other specified pregnancy related conditions, third trimester: Secondary | ICD-10-CM | POA: Insufficient documentation

## 2023-07-15 DIAGNOSIS — O09293 Supervision of pregnancy with other poor reproductive or obstetric history, third trimester: Secondary | ICD-10-CM | POA: Diagnosis not present

## 2023-07-15 DIAGNOSIS — Z87730 Personal history of (corrected) cleft lip and palate: Secondary | ICD-10-CM | POA: Diagnosis not present

## 2023-07-15 DIAGNOSIS — Z3A36 36 weeks gestation of pregnancy: Secondary | ICD-10-CM | POA: Diagnosis not present

## 2023-07-15 DIAGNOSIS — O0933 Supervision of pregnancy with insufficient antenatal care, third trimester: Secondary | ICD-10-CM | POA: Insufficient documentation

## 2023-07-15 DIAGNOSIS — O0942 Supervision of pregnancy with grand multiparity, second trimester: Secondary | ICD-10-CM

## 2023-07-15 DIAGNOSIS — O0930 Supervision of pregnancy with insufficient antenatal care, unspecified trimester: Secondary | ICD-10-CM | POA: Insufficient documentation

## 2023-07-15 DIAGNOSIS — Z3480 Encounter for supervision of other normal pregnancy, unspecified trimester: Secondary | ICD-10-CM | POA: Diagnosis not present

## 2023-07-15 NOTE — Progress Notes (Signed)
 Maternal-Fetal Medicine Consultation Name: Amanda Sloan MRN: [id number]  G7 Q4696 at 36-weeks' gestation.  -Late prenatal care. -Grand multigravida.  Obstetric history significant for 6 term vaginal deliveries. -Rh negative status.  Patient does not want to take RhoGAM in pregnancy.  On cell-free fetal DNA screening, the risks of fetal aneuploidies are not increased.  Patient does not have gestational diabetes.  Blood pressure today at our office is 108/57 mmHg.  Patient is very sure of her LMP.  Ultrasound Fetal growth is appropriate for gestational age.  Fetal biometry lags gestational age by 10 days.  Amniotic fluid is normal good fetal activity seen.  No obvious fetal structural defects are seen.  Cephalic presentation.  Female fetus.  Our concerns include: Grand multigravida Patient does not give history of postpartum hemorrhage.  Grand multigravida is associated with slight increase in postpartum hemorrhage.  She has anemia (hemoglobin 10.2) and cannot tolerate oral iron.  She does not want intravenous infusion.  Patient is aware of possibility of blood transfusion if she has significant hemorrhage after birth.  Rh negative status Patient reports all her children are Rh positive (all from same partner) and she received RhoGAM after delivery.  I informed her that antepartum RhoGAM reduces the likelihood of fetal hemolytic disease. I counseled the patient that we cannot determine fetal Rh status on her cell free fetal DNA sample.  Patient reports she will not take RhoGAM even if the fetus is Rh positive.   Recommendations -No follow-up appointments were made.  Consultation including face-to-face (more than 50%) counseling 30 minutes.

## 2023-07-18 ENCOUNTER — Encounter: Admitting: Advanced Practice Midwife

## 2023-07-20 ENCOUNTER — Ambulatory Visit: Attending: Obstetrics and Gynecology | Admitting: Physical Therapy

## 2023-07-20 DIAGNOSIS — R262 Difficulty in walking, not elsewhere classified: Secondary | ICD-10-CM | POA: Diagnosis present

## 2023-07-20 DIAGNOSIS — M25551 Pain in right hip: Secondary | ICD-10-CM | POA: Diagnosis present

## 2023-07-20 DIAGNOSIS — M25552 Pain in left hip: Secondary | ICD-10-CM | POA: Insufficient documentation

## 2023-07-20 DIAGNOSIS — M6281 Muscle weakness (generalized): Secondary | ICD-10-CM | POA: Insufficient documentation

## 2023-07-20 NOTE — Therapy (Signed)
 OUTPATIENT PHYSICAL THERAPY LOWER EXTREMITY PROGRESS NOTE   Patient Name: Amanda Sloan MRN: [id number] DOB:1995/06/27, 28 y.o., female Today's Date: 07/20/2023  END OF SESSION:  PT End of Session - 07/20/23 1527     Visit Number 4    Number of Visits 7    Date for PT Re-Evaluation 08/11/23    Authorization Type [employer] 7 visits 5/1-6/29    PT Start Time 1530    PT Stop Time 1608    PT Time Calculation (min) 38 min    Activity Tolerance Patient tolerated treatment well             Past Medical History:  Diagnosis Date   Anxiety    Depression    PTSD (post-traumatic stress disorder)    Past Surgical History:  Procedure Laterality Date   NO PAST SURGERIES     Patient Active Problem List   Diagnosis Date Noted   PTSD, anxiety, depression 06/05/2023   Anemia 06/05/2023   Atypical squamous cells cannot exclude high grade squamous intraepithelial lesion on cytologic smear of cervix (ASC-H) 06/04/2023   Rubella non-immune status, antepartum 06/01/2023   Trichomonal vaginitis in pregnancy 06/01/2023   Supervision of other normal pregnancy, antepartum 05/30/2023   Rh negative, maternal 11/17/2015   Late prenatal care 11/17/2015   Family history of sickle cell anemia 11/17/2015   Family history of cleft lip 11/17/2015    PCP: none  REFERRING PROVIDER: Susi Eric FNP  REFERRING DIAG: M25.551, M25.552 bil hip pain  THERAPY DIAG:  Bil hip pain; weaknesss  Rationale for Evaluation and Treatment: Rehabilitation  ONSET DATE: February  SUBJECTIVE:   SUBJECTIVE STATEMENT: I'm feeling much better.  I'm not sure if I should put the KT on or not.  I have my tape in the truck.  The pain is rare now.  Not an all day thing now.  I  missed my OB appt this week with so much going on.  I had my first U/S last week.     36 1/[redacted] weeks pregnant with 7th child; had a fall from a high bed in February landing onto left side since then the pain has worsened left > right SI  joint  PERTINENT HISTORY: Anxiety PAIN:  Are you having pain? Yes NPRS scale: 2-3 Pain location: left > right SI joint Pain orientation: Bilateral  PAIN TYPE: throbbing Pain description: constant  Aggravating factors: walking fast, sitting erect, getting out of high bed, driving a SUV, picking up toddlers Relieving factors: sleep with pillow b/w legs; heat  PRECAUTIONS: None   WEIGHT BEARING RESTRICTIONS: No  FALLS:  Has patient fallen in last 6 months? Yes. Number of falls 1 fall onto left side   LIVING ENVIRONMENT: Lives with: lives with their family Lives in: House/apartment  PLOF: Independent  PATIENT GOALS: pain relief; able to pick up her toddler; be able to have a natural child birth (water)   OBJECTIVE:  Note: Objective measures were completed at Evaluation unless otherwise noted.   PATIENT SURVEYS:  LEFS 15/80  COGNITION: Overall cognitive status: Within functional limits for tasks assessed      PALPATION: Tender left PSIS, SI joint, medial gluteals left  Painful lumbar extension and with trunk sidebending  LOWER EXTREMITY ROM: WFLS  LOWER EXTREMITY MMT: decreased left gluteal activation 4-/5 strength grade with pelvic drop noted with SLS  LOWER EXTREMITY SPECIAL TESTS:  Increased pain with SLS 5-8 sec right/left Able to squat 1/2 way, painful on the rise  GAIT:  decreased gait speed   TREATMENT DATE: 07/20/23  Sitting on blue ball side pelvic mobility particularly openers stretches/adductor stretching  Quadruped with yoga block under knee with cat/cow 10x Standing with foot on yoga block with trunk rotation 10x right/left  (feels good on the left side, no pain) Education on squatting form Discussion of post partum exercise including modified planks: wall and counter [facility] Discussed use of skin protectant "Skin-Prep" on back before taping (no longer itching but faint skin color change from prior  taping)      TREATMENT DATE: 07/07/23  Patient has a red skin reaction from KT tape, discussed anti-itch strategies  and after healed discussed a skin barrier (milk of magnesium) Serola and SI.LOC options for sacral support Sitting with lumbar roll support; avoid sacral sitting Seated with feet on foot stool with ball squeeze 5 sec hold Seated with feet on stool with black band hip abduction isometric 5 sec hold Sit to stand with ball squeeze 5x  Sit to stand with black band abduction isometric 2x   TREATMENT DATE: 06/20/23    Patient education: SI joint anatomy and the effects of pregnancy on mobility of this joint Sitting on blue ball pelvic mobility Patient education on "opening" techniques of anterior hips Sidelying muscle energy: left manually resisted hip abduction/external rotation 5 sec hold 10x KT taping:  1 strip across SI joint (horizontal); 2 vertical strips from SI joint lines superior to low thoracic                                                                                        TREATMENT DATE: 06/16/23 evaluation  KT taping for SI joint: pt tapes her abdomen  Initial HEP   PATIENT EDUCATION:  Education details: Educated patient on anatomy and physiology of current symptoms, prognosis, plan of care as well as initial self care strategies to promote recovery Person educated: Patient Education method: Explanation Education comprehension: verbalized understanding  HOME EXERCISE PROGRAM: Access Code: [email or web address] URL: [email or web address] Date: 06/16/2023 Prepared by: Darien Eden  Exercises - Quadruped Rocking Slow  - 1 x daily - 7 x weekly - 1 sets - 10 reps - Cat Cow  - 1 x daily - 7 x weekly - 3 sets - 10 reps - Sidelying Reverse Clamshell  - 1 x daily - 7 x weekly - 1 sets - 10 reps - Tail Wag  - 1 x daily - 7 x weekly - 1 sets - 10 reps  ASSESSMENT:  CLINICAL IMPRESSION: Amanda Sloan reports she has been feeling much better since KT lumbo  sacral taping.  She responds well to pelvic shifts in quadruped and standing.   She is currently 36 1/[redacted] weeks pregnant.  No further appts scheduled since she is doing well but she will call to set up appt if needed in the next few weeks.  Discussed a new PT referral if needed at her 6 week post partum visit.     EVAL: Patient is a 28 y.o. female who was seen today for physical therapy evaluation and treatment for bilateral hip pain during pregnancy and following a fall  onto her left side in February.  Pain located in left > right SI joint with tenderness over joint line and medial gluteals.  Pain is severe at times and is aggravated with walking quickly, getting out of bed, and picking up her toddlers.  Decreased left gluteal muscle strength noted with pelvic drop with single leg stance.  She would benefit from PT to address these impairments affecting her ADLs during the remainder of her pregnancy and ability to take care of her 6 children.   OBJECTIVE IMPAIRMENTS: decreased activity tolerance, difficulty walking, decreased strength, impaired perceived functional ability, and pain.   ACTIVITY LIMITATIONS: carrying, lifting, sitting, sleeping, and locomotion level  PARTICIPATION LIMITATIONS: meal prep, cleaning, laundry, and driving  PERSONAL FACTORS: 1 comorbidity: 3rd trimester pregnancy are also affecting patient's functional outcome.   REHAB POTENTIAL: Good  CLINICAL DECISION MAKING: Stable/uncomplicated  EVALUATION COMPLEXITY: Low   GOALS: Goals reviewed with patient? Yes  SHORT TERM GOALS: Target date: 07/14/2023   The patient will demonstrate knowledge of basic self care strategies including KT taping and exercises to promote healing  Baseline: Goal status: met 6/4 2.  The patient will have improved hip strength to at least 4/5 needed for standing, walking longer distances and lifting her toddlers Baseline:  Goal status: met 6/4  3.  The patient will be able to walk 5-7 min with  pain < 5/10 Baseline:  Goal status: met 6/4        LONG TERM GOALS: Target date: 08/11/2023   The patient will be independent in a safe self progression of a home exercise program to promote further recovery of function  Baseline:  Goal status: met 6/4  2.  The patient will have improved hip strength to at least 4+/5 needed for standing, walking longer distances and lifting her toddlers Baseline:  Goal status: ongoing  3.  The patient will report a 40% reduction in hip pain with walking, sitting and driving Baseline:  Goal status: met 6/4  4.  LEFS 30/80 indicating improved function with less pain Baseline:  Goal status: ongoing     PLAN:  PT FREQUENCY: 1x/week  PT DURATION: 8 weeks  PLANNED INTERVENTIONS: 97164- PT Re-evaluation, 97110-Therapeutic exercises, 97530- Therapeutic activity, 97112- Neuromuscular re-education, 97535- Self Care, 82956- Manual therapy, Patient/Family education, Taping, Dry Needling, Joint mobilization, Spinal mobilization, Cryotherapy, and Moist heat  PLAN FOR NEXT SESSION: pt to call in the next few weeks of June if further treatment is needed prior to birth; seated green ball ex's;  left gluteal strengthening  Darien Eden, PT 07/20/23 4:20 PM Phone: [phone] Fax: [phone]

## 2023-07-22 ENCOUNTER — Observation Stay (HOSPITAL_COMMUNITY)
Admission: AD | Admit: 2023-07-22 | Discharge: 2023-07-22 | Disposition: A | Discharge status: Home / Self Care | Attending: Obstetrics and Gynecology | Admitting: Obstetrics and Gynecology

## 2023-07-22 ENCOUNTER — Encounter (HOSPITAL_COMMUNITY): Payer: Self-pay | Admitting: Obstetrics and Gynecology

## 2023-07-22 ENCOUNTER — Other Ambulatory Visit: Payer: Self-pay

## 2023-07-22 DIAGNOSIS — F17210 Nicotine dependence, cigarettes, uncomplicated: Secondary | ICD-10-CM | POA: Diagnosis not present

## 2023-07-22 DIAGNOSIS — O4290 Premature rupture of membranes, unspecified as to length of time between rupture and onset of labor, unspecified weeks of gestation: Principal | ICD-10-CM | POA: Diagnosis present

## 2023-07-22 DIAGNOSIS — O4292 Full-term premature rupture of membranes, unspecified as to length of time between rupture and onset of labor: Principal | ICD-10-CM | POA: Diagnosis present

## 2023-07-22 DIAGNOSIS — Z91013 Allergy to seafood: Secondary | ICD-10-CM

## 2023-07-22 DIAGNOSIS — Z3A37 37 weeks gestation of pregnancy: Secondary | ICD-10-CM

## 2023-07-22 DIAGNOSIS — O99333 Smoking (tobacco) complicating pregnancy, third trimester: Secondary | ICD-10-CM | POA: Diagnosis present

## 2023-07-22 DIAGNOSIS — O4193X0 Disorder of amniotic fluid and membranes, unspecified, third trimester, not applicable or unspecified: Principal | ICD-10-CM | POA: Insufficient documentation

## 2023-07-22 DIAGNOSIS — Z833 Family history of diabetes mellitus: Secondary | ICD-10-CM

## 2023-07-22 DIAGNOSIS — Z5329 Procedure and treatment not carried out because of patient's decision for other reasons: Secondary | ICD-10-CM | POA: Diagnosis present

## 2023-07-22 LAB — POCT FERN TEST: POCT Fern Test: POSITIVE

## 2023-07-22 LAB — CBC
HCT: 33.1 % — ABNORMAL LOW (ref 36.0–46.0)
Hemoglobin: 10.4 g/dL — ABNORMAL LOW (ref 12.0–15.0)
MCH: 29.1 pg (ref 26.0–34.0)
MCHC: 31.4 g/dL (ref 30.0–36.0)
MCV: 92.5 fL (ref 80.0–100.0)
Platelets: 195 10*3/uL (ref 150–400)
RBC: 3.58 MIL/uL — ABNORMAL LOW (ref 3.87–5.11)
RDW: 13.2 % (ref 11.5–15.5)
WBC: 9.4 10*3/uL (ref 4.0–10.5)
nRBC: 0 % (ref 0.0–0.2)

## 2023-07-22 LAB — COMPREHENSIVE METABOLIC PANEL WITH GFR
ALT: 10 U/L (ref 0–44)
AST: 21 U/L (ref 15–41)
Albumin: 3.1 g/dL — ABNORMAL LOW (ref 3.5–5.0)
Alkaline Phosphatase: 129 U/L — ABNORMAL HIGH (ref 38–126)
Anion gap: 15 (ref 5–15)
BUN: <5 mg/dL — ABNORMAL LOW (ref 6–20)
CO2: 18 mmol/L — ABNORMAL LOW (ref 22–32)
Calcium: 9.3 mg/dL (ref 8.9–10.3)
Chloride: 104 mmol/L (ref 98–111)
Creatinine, Ser: 0.43 mg/dL — ABNORMAL LOW (ref 0.44–1.00)
GFR, Estimated: >60 mL/min (ref >60)
Glucose, Bld: 89 mg/dL (ref 70–99)
Potassium: 3.9 mmol/L (ref 3.5–5.1)
Sodium: 137 mmol/L (ref 135–145)
Total Bilirubin: 0.3 mg/dL (ref 0.0–1.2)
Total Protein: 6.6 g/dL (ref 6.5–8.1)

## 2023-07-22 MED ORDER — LACTATED RINGERS IV SOLN: 500.0000 mL | INTRAVENOUS | Status: DC | PRN

## 2023-07-22 MED ORDER — ACETAMINOPHEN 325 MG PO TABS: 650.0000 mg | ORAL_TABLET | ORAL | Status: DC | PRN

## 2023-07-22 MED ORDER — SOD CITRATE-CITRIC ACID 500-334 MG/5ML PO SOLN
30.0000 mL | ORAL | Status: DC | PRN
Start: 2023-07-22 — End: 2023-07-23

## 2023-07-22 MED ORDER — ONDANSETRON HCL 4 MG/2ML IJ SOLN: 4.0000 mg | Freq: Four times a day (QID) | INTRAMUSCULAR | Status: DC | PRN

## 2023-07-22 MED ORDER — OXYTOCIN BOLUS FROM INFUSION
333.0000 mL | Freq: Once | INTRAVENOUS | Status: DC
Start: 2023-07-22 — End: 2023-07-23

## 2023-07-22 MED ORDER — OXYCODONE-ACETAMINOPHEN 5-325 MG PO TABS: 1.0000 | ORAL_TABLET | ORAL | Status: DC | PRN

## 2023-07-22 MED ORDER — LIDOCAINE HCL (PF) 1 % IJ SOLN: 30.0000 mL | INTRAMUSCULAR | Status: DC | PRN

## 2023-07-22 MED ORDER — LACTATED RINGERS IV SOLN: INTRAVENOUS | Status: DC

## 2023-07-22 MED ORDER — FENTANYL CITRATE (PF) 100 MCG/2ML IJ SOLN: 50.0000 ug | INTRAMUSCULAR | Status: DC | PRN

## 2023-07-22 MED ORDER — OXYCODONE-ACETAMINOPHEN 5-325 MG PO TABS: 2.0000 | ORAL_TABLET | ORAL | Status: DC | PRN

## 2023-07-22 MED ORDER — OXYTOCIN-SODIUM CHLORIDE 30-0.9 UT/500ML-% IV SOLN: 2.5000 [IU]/h | INTRAVENOUS | Status: DC

## 2023-07-22 NOTE — Progress Notes (Signed)
 Amanda Sloan is a 28 y.o. G7P6006 at [redacted]w[redacted]d by LMP admitted for SROM.  Subjective: Patient is sitting comfortably in bed. She reports feeling some contractions but has not been timing them. She declines cervical exam at this time, but will let us  know if she changes her mind.  Objective: BP 118/76   Pulse 89   Temp 97.8 F (36.6 C) (Oral)   Resp 18   Ht 5\' 7"  (1.702 m)   Wt 68.9 kg   LMP 11/05/2022 Comment: has appt january 13  SpO2 99%   BMI 23.81 kg/m     FHT:  FHR: 130 bpm, variability: moderate,  accelerations:  Present,  decelerations:  Absent UC:   irregular, every 10 minutes SVE:   Exam by:: verified by u/s per Dr. Cooper Denver  Labs: Lab Results  Component Value Date   WBC 9.4 07/22/2023   HGB 10.4 (L) 07/22/2023   HCT 33.1 (L) 07/22/2023   MCV 92.5 07/22/2023   PLT 195 07/22/2023    Assessment / Plan: Prelabor rupture of membranes. Cat 1 FHT  Labor: Patient declines cervical exam for now. Prefers expectant management. Preeclampsia:  N/A Fetal Wellbeing:  Category I Pain Control:  Labor support without medications and Water tub I/D:  GBS unknown, 37 weeks. Culture pending.  Anticipated MOD:  NSVD  Wilford Hanks, Student-MidWife 07/22/2023, 4:39 PM

## 2023-07-22 NOTE — Progress Notes (Signed)
 Patient ID: Amanda Sloan, female   DOB: 1996-02-06, 28 y.o.   MRN: [id number]  BP 110/64   Pulse 100   Temp 97.8 F (36.6 C) (Oral)   Resp 18   Ht 5\' 7"  (1.702 m)   Wt 152 lb (68.9 kg)   LMP 11/05/2022 Comment: has appt january 13  SpO2 99%   BMI 23.81 kg/m    Dilation: 1.5 Cervical Position: Posterior Presentation: Vertex Exam by:: Lemuel Quaker, CNM   Pt requested CNM to room to discuss options. Contractions have spaced to >72min, pt requested cervical exam. Performed with RN in room. Pt desiring to leave. Advised against leaving due to increased risk of infection with PROM. Offered more time for ambulation, discussed use of foley balloon, low-dose pitocin , etc. Gave warning signs for return including s/sx of infection including fever, aches, malaise, decreased fetal movement, change in fluid color, bleeding, absence of contractions at the 24hr mark, etc. Strongly encouraged her to stay, pt declined and signed out AMA.  Lemuel Quaker, CNM, MSN, IBCLC Certified Nurse Midwife, [facility]

## 2023-07-22 NOTE — MAU Provider Note (Signed)
  S: Amanda Sloan is a 28 y.o. [phone] at [redacted]w[redacted]d  who presents to MAU today complaining of leaking of fluid since 0100. She denies vaginal bleeding. She denies contractions. She reports normal fetal movement.    O: BP 111/69 (BP Location: Right Arm)   Pulse (!) 112   Temp 98.6 F (37 C) (Oral)   Resp 15   Ht 5\' 7"  (1.702 m)   Wt 68.9 kg   LMP 11/05/2022 Comment: has appt january 13  SpO2 99%   BMI 23.81 kg/m  GENERAL: Well-developed, well-nourished female in no acute distress.  HEAD: Normocephalic, atraumatic.  CHEST: Normal effort of breathing, regular heart rate ABDOMEN: Soft, nontender, gravid PELVIC: Normal external female genitalia. Vagina is pink and rugated. Cervix with normal contour, no lesions. Normal discharge.  Positive pooling.   Bedside US : Vertex  Fetal Monitoring: Baseline: 145 Variability: moderate Accelerations: present Decelerations: absent Contractions: Rare  No results found for this or any previous visit (from the past 24 hours).   A: SIUP at [redacted]w[redacted]d  SROM  P: Admit to L&D  Darrow End, MD 07/22/2023 12:37 PM

## 2023-07-22 NOTE — Discharge Summary (Signed)
 Postpartum Discharge Summary    Patient Name: Amanda Sloan DOB: 26-Apr-1995 MRN: [id number]  Date of admission: 07/22/2023 Delivery date:This patient has no babies on file. Delivering provider: This patient has no babies on file. Date of discharge: 07/22/2023  Admitting diagnosis: Leakage of amniotic fluid [O42.90] Intrauterine pregnancy: [redacted]w[redacted]d     Secondary diagnosis:  Principal Problem:   Leakage of amniotic fluid  Additional problems: None    Discharge diagnosis: Signed out AMA after PROM in latent labor                                              Post partum procedures:None Augmentation: N/A Complications: None  Hospital course: Pt admitted with confirmed PROM  Magnesium Sulfate received: No BMZ received: No Rhophylac :N/A MMR:N/A T-DaP:Declined Flu: N/A RSV Vaccine received: No Transfusion:No  Immunizations received: Immunization History  Administered Date(s) Administered   Tdap 02/16/2023    Physical exam  Vitals:   07/22/23 1216 07/22/23 1405 07/22/23 1615  BP: 111/69 118/76 110/64  Pulse: (!) 112 89 100  Resp: 15 18   Temp: 98.6 F (37 C) 97.8 F (36.6 C)   TempSrc: Oral Oral   SpO2: 99%    Weight: 152 lb (68.9 kg)    Height: 5\' 7"  (1.702 m)     General: alert, cooperative, and no distress Lochia: Not delivered Uterine Fundus: gravid Incision: N/A DVT Evaluation: No evidence of DVT seen on physical exam. Labs: Lab Results  Component Value Date   WBC 9.4 07/22/2023   HGB 10.4 (L) 07/22/2023   HCT 33.1 (L) 07/22/2023   MCV 92.5 07/22/2023   PLT 195 07/22/2023      Latest Ref Rng & Units 07/22/2023    1:17 PM  CMP  Glucose 70 - 99 mg/dL 89   BUN 6 - 20 mg/dL <5   Creatinine 1.66 - 1.00 mg/dL 0.63   Sodium 016 - 010 mmol/L 137   Potassium 3.5 - 5.1 mmol/L 3.9   Chloride 98 - 111 mmol/L 104   CO2 22 - 32 mmol/L 18   Calcium 8.9 - 10.3 mg/dL 9.3   Total Protein 6.5 - 8.1 g/dL 6.6   Total Bilirubin 0.0 - 1.2 mg/dL 0.3   Alkaline Phos 38 -  126 U/L 129   AST 15 - 41 U/L 21   ALT 0 - 44 U/L 10    Edinburgh Score:     No data to display         No data recorded  After visit meds:  Allergies as of 07/22/2023       Reactions   Pork-derived Products    For religious reasons   Shellfish Allergy    For religious reasons        Medication List     TAKE these medications    cyclobenzaprine  10 MG tablet Commonly known as: FLEXERIL  Take 1 tablet (10 mg total) by mouth every 8 (eight) hours as needed for muscle spasms.   ferrous sulfate  325 (65 FE) MG tablet Take 1 tablet (325 mg total) by mouth every other day.       Discharge order in for [facility] Discharge instruction: per After Visit Summary and Postpartum booklet.  Return precautions given and advised against leaving.  07/22/2023 Derick Fleeting, CNM

## 2023-07-22 NOTE — H&P (Signed)
 OBSTETRIC ADMISSION HISTORY AND PHYSICAL  Amanda Sloan is a 28 y.o. female [phone] with IUP at 100w0d by LMP presenting for SROM. She reports +FMs, No LOF, no VB, no blurry vision, headaches or peripheral edema, and RUQ pain.  She plans on breast feeding. She request POPs for birth control. She received her prenatal care at [facility]   Dating: By LMP --->  Estimated Date of Delivery: 08/12/23  Sono:   @[redacted]w[redacted]d , CWD, normal female anatomy, cephalic presentation, right lateral placental lie, 2547 gm 5 lb 10 oz 23 %  EFW   Prenatal History/Complications:  - Late to prenatal care - [facility] - Hx of babe with cleft lip  Past Medical History: Past Medical History:  Diagnosis Date   Anxiety    Depression    PTSD (post-traumatic stress disorder)     Past Surgical History: Past Surgical History:  Procedure Laterality Date   NO PAST SURGERIES      Obstetrical History: OB History     Gravida  7   Para  6   Term  6   Preterm      AB      Living  6      SAB      IAB      Ectopic      Multiple  0   Live Births  6           Social History Social History   Socioeconomic History   Marital status: Married    Spouse name: Not on file   Number of children: 6   Years of education: Not on file   Highest education level: Not on file  Occupational History   Not on file  Tobacco Use   Smoking status: Some Days    Current packs/day: 0.00    Types: Cigarettes    Last attempt to quit: 2016    Years since quitting: 9.4   Smokeless tobacco: Never  Vaping Use   Vaping status: Never Used  Substance and Sexual Activity   Alcohol use: No   Drug use: Not Currently    Types: Marijuana    Comment: last smoked weed about 1900   Sexual activity: Yes    Comment: pregnant  Other Topics Concern   Not on file  Social History Narrative   Not on file   Social Drivers of Health   Financial Resource Strain: Not on File (06/10/2021)   Received from [employer], [employer]    Financial Resource Strain: 0  Food Insecurity: Not on File (11/11/2022)   Received from [facility] Insecurity    Food: 0  Transportation Needs: Not on File (06/10/2021)   Received from [facility] [employer] Needs    Transportation: 0  Physical Activity: Not on File (06/10/2021)   Received from [place], [place]   Physical Activity    Physical Activity: 0  Stress: Not on File (06/10/2021)   Received from [facility], [place]   Stress    Stress: 0  Social Connections: Not on File (10/30/2022)   Received from [employer]   Social Connections    Connectedness: 0    Family History: Family History  Problem Relation Age of Onset   Diabetes Mother    Diabetes Father    Other Neg Hx     Allergies: Allergies  Allergen Reactions   Pork-Derived Products     For religious reasons   Shellfish Allergy  For religious reasons    Medications Prior to Admission  Medication Sig Dispense Refill Last Dose/Taking   cyclobenzaprine  (FLEXERIL ) 10 MG tablet Take 1 tablet (10 mg total) by mouth every 8 (eight) hours as needed for muscle spasms. 30 tablet 1    ferrous sulfate  325 (65 FE) MG tablet Take 1 tablet (325 mg total) by mouth every other day. (Patient not taking: Reported on 06/29/2023) 90 tablet 1      Review of Systems   All systems reviewed and negative except as stated in HPI  Blood pressure 111/69, pulse (!) 112, temperature 98.6 F (37 C), temperature source Oral, resp. rate 15, height 5\' 7"  (1.702 m), weight 68.9 kg, last menstrual period 11/05/2022, SpO2 99%, unknown if currently breastfeeding. General appearance: alert, cooperative, appears stated age, and no distress Lungs: clear to auscultation bilaterally Heart: regular rate and rhythm Abdomen: soft, non-tender; bowel sounds normal Pelvic: adequate, proven to 3039g. Pooling positive, with vernix. Extremities: Homans sign is negative, no sign of DVT Presentation: cephalic Fetal monitoringBaseline:  145 bpm, Variability: Good {> 6 bpm), Accelerations: Reactive, and Decelerations: Absent Uterine activity rare, mild    Prenatal labs: ABO, Rh: O/Negative/-- (04/15 1022) Antibody: Negative (04/15 1022) Rubella: <0.90 (04/15 1022) RPR: Non Reactive (04/30 0921)  HBsAg: Negative (04/15 1022)  HIV: Non Reactive (04/30 0921)  GBS:      Lab Results  Component Value Date   GBS Negative 10/30/2015   GTT normal Genetic screening  low risk female, negative Anatomy US  normal  Immunization History  Administered Date(s) Administered   Tdap 02/16/2023    Prenatal Transfer Tool  Maternal Diabetes: No Genetic Screening: Normal Maternal Ultrasounds/Referrals: Normal Fetal Ultrasounds or other Referrals:  None Maternal Substance Abuse:  No Significant Maternal Medications:  None Significant Maternal Lab Results: Other:  GBS unknown, culture pending Number of Prenatal Visits:greater than 3 verified prenatal visits Maternal Vaccinations:TDap Other Comments:  None   Results for orders placed or performed during the hospital encounter of 07/22/23 (from the past 24 hours)  POCT fern test   Collection Time: 07/22/23 12:49 PM  Result Value Ref Range   POCT Fern Test Positive = ruptured amniotic membanes     Patient Active Problem List   Diagnosis Date Noted   PTSD, anxiety, depression 06/05/2023   Anemia 06/05/2023   Atypical squamous cells cannot exclude high grade squamous intraepithelial lesion on cytologic smear of cervix (ASC-H) 06/04/2023   Rubella non-immune status, antepartum 06/01/2023   Trichomonal vaginitis in pregnancy 06/01/2023   Supervision of other normal pregnancy, antepartum 05/30/2023   Rh negative, maternal 11/17/2015   Late prenatal care 11/17/2015   Family history of sickle cell anemia 11/17/2015   Family history of cleft lip 11/17/2015    Assessment/Plan:  Amanda Sloan is a 28 y.o. G7P6006 at [redacted]w[redacted]d here for PROM 6/6 @ 0100  #Labor: Expectant  management #Pain: Per pt request including immersion #FWB: Cat I; appropriate for intermittent auscultation #GBS status: unknown, culture pending #Feeding: Breastmilk  #Reproductive Life planning: Progesterone only pills #Circ:  not applicable  [place] End, MD  07/22/2023, 12:55 PM

## 2023-07-22 NOTE — Progress Notes (Signed)
 Pt leaving AMA. Pt not having regular contractions and cervix is 1.5 cm dilated. Pt prefers to go home and come back when in more active labor. Pt informed on risks of being ruptured with no signs of labor. Pt verbalized an understanding of the risks explained to her. AMA paper signed and pt walked off the unit.

## 2023-07-22 NOTE — MAU Note (Signed)
 Amanda Sloan is a 28 y.o. at [redacted]w[redacted]d here in MAU reporting: she started leaking fluid since 1am. Denies contractions. Reports positive fetal movement but it is decreased.   Onset of complaint: 1am Pain score: 0 Vitals:   07/22/23 1216  BP: 111/69  Pulse: (!) 112  Resp: 15  Temp: 98.6 F (37 C)  SpO2: 99%     FHT: 144  Lab orders placed from triage:

## 2023-07-23 ENCOUNTER — Other Ambulatory Visit: Payer: Self-pay

## 2023-07-23 ENCOUNTER — Encounter (HOSPITAL_COMMUNITY): Payer: Self-pay | Admitting: Anesthesiology

## 2023-07-23 ENCOUNTER — Inpatient Hospital Stay (HOSPITAL_COMMUNITY)
Admission: AD | Admit: 2023-07-23 | Discharge: 2023-07-26 | DRG: 807 | Disposition: A | Discharge status: Home / Self Care | Attending: Family Medicine | Admitting: Family Medicine

## 2023-07-23 ENCOUNTER — Encounter (HOSPITAL_COMMUNITY): Payer: Self-pay | Admitting: Family Medicine

## 2023-07-23 DIAGNOSIS — Z3A37 37 weeks gestation of pregnancy: Secondary | ICD-10-CM

## 2023-07-23 DIAGNOSIS — O4292 Full-term premature rupture of membranes, unspecified as to length of time between rupture and onset of labor: Secondary | ICD-10-CM | POA: Diagnosis present

## 2023-07-23 DIAGNOSIS — O99344 Other mental disorders complicating childbirth: Secondary | ICD-10-CM | POA: Diagnosis not present

## 2023-07-23 DIAGNOSIS — O4290 Premature rupture of membranes, unspecified as to length of time between rupture and onset of labor, unspecified weeks of gestation: Secondary | ICD-10-CM | POA: Diagnosis present

## 2023-07-23 DIAGNOSIS — O9832 Other infections with a predominantly sexual mode of transmission complicating childbirth: Secondary | ICD-10-CM | POA: Diagnosis not present

## 2023-07-23 DIAGNOSIS — O4202 Full-term premature rupture of membranes, onset of labor within 24 hours of rupture: Secondary | ICD-10-CM | POA: Diagnosis not present

## 2023-07-23 LAB — CBC
HCT: 31 % — ABNORMAL LOW (ref 36.0–46.0)
Hemoglobin: 9.9 g/dL — ABNORMAL LOW (ref 12.0–15.0)
MCH: 29.2 pg (ref 26.0–34.0)
MCHC: 31.9 g/dL (ref 30.0–36.0)
MCV: 91.4 fL (ref 80.0–100.0)
Platelets: 189 10*3/uL (ref 150–400)
RBC: 3.39 MIL/uL — ABNORMAL LOW (ref 3.87–5.11)
RDW: 13.2 % (ref 11.5–15.5)
WBC: 7.8 10*3/uL (ref 4.0–10.5)
nRBC: 0 % (ref 0.0–0.2)

## 2023-07-23 LAB — TYPE AND SCREEN
ABO/RH(D): O NEG
ABO/RH(D): O NEG
Antibody Screen: NEGATIVE
Antibody Screen: NEGATIVE

## 2023-07-23 LAB — CULTURE, BETA STREP (GROUP B ONLY)

## 2023-07-23 LAB — RPR: RPR Ser Ql: NONREACTIVE

## 2023-07-23 MED ORDER — MISOPROSTOL 50MCG HALF TABLET
50.0000 ug | ORAL_TABLET | ORAL | Status: DC | PRN
  Administered 2023-07-23: 50 ug via ORAL
  Filled 2023-07-23: qty 1

## 2023-07-23 MED ORDER — FENTANYL-BUPIVACAINE-NACL 0.5-0.125-0.9 MG/250ML-% EP SOLN: 12.0000 mL/h | EPIDURAL | Status: DC | PRN

## 2023-07-23 MED ORDER — LIDOCAINE HCL 1 % IJ SOLN: 0.0000 mL | Freq: Once | INTRAMUSCULAR | Status: DC | PRN

## 2023-07-23 MED ORDER — PRENATAL MULTIVITAMIN CH
1.0000 | ORAL_TABLET | Freq: Every day | ORAL | Status: DC
  Administered 2023-07-24 – 2023-07-26 (×3): 1 via ORAL
  Filled 2023-07-23 (×3): qty 1

## 2023-07-23 MED ORDER — EPHEDRINE 5 MG/ML INJ: 10.0000 mg | INTRAVENOUS | Status: DC | PRN

## 2023-07-23 MED ORDER — ONDANSETRON HCL 4 MG/2ML IJ SOLN: 4.0000 mg | Freq: Four times a day (QID) | INTRAMUSCULAR | Status: DC | PRN

## 2023-07-23 MED ORDER — SOD CITRATE-CITRIC ACID 500-334 MG/5ML PO SOLN: 30.0000 mL | ORAL | Status: DC | PRN

## 2023-07-23 MED ORDER — OXYCODONE HCL 5 MG PO TABS: 10.0000 mg | ORAL_TABLET | ORAL | Status: DC | PRN

## 2023-07-23 MED ORDER — TERBUTALINE SULFATE 1 MG/ML IJ SOLN: 0.2500 mg | Freq: Once | INTRAMUSCULAR | Status: DC | PRN

## 2023-07-23 MED ORDER — SODIUM CHLORIDE 0.9 % IV SOLN
250.0000 mL | INTRAVENOUS | Status: DC | PRN
Start: 2023-07-23 — End: 2023-07-26

## 2023-07-23 MED ORDER — OXYTOCIN BOLUS FROM INFUSION: 333.0000 mL | Freq: Once | INTRAVENOUS | Status: DC

## 2023-07-23 MED ORDER — OXYTOCIN-SODIUM CHLORIDE 30-0.9 UT/500ML-% IV SOLN
2.5000 [IU]/h | INTRAVENOUS | Status: DC
Start: 2023-07-23 — End: 2023-07-23

## 2023-07-23 MED ORDER — DIBUCAINE (PERIANAL) 1 % EX OINT
1.0000 | TOPICAL_OINTMENT | CUTANEOUS | Status: DC | PRN
Start: 2023-07-23 — End: 2023-07-26

## 2023-07-23 MED ORDER — LIDOCAINE HCL (PF) 1 % IJ SOLN
30.0000 mL | INTRAMUSCULAR | Status: DC | PRN
Start: 2023-07-23 — End: 2023-07-23

## 2023-07-23 MED ORDER — PHENYLEPHRINE 80 MCG/ML (10ML) SYRINGE FOR IV PUSH (FOR BLOOD PRESSURE SUPPORT): 80.0000 ug | PREFILLED_SYRINGE | INTRAVENOUS | Status: DC | PRN

## 2023-07-23 MED ORDER — OXYCODONE HCL 5 MG PO TABS
5.0000 mg | ORAL_TABLET | ORAL | Status: DC | PRN
  Filled 2023-07-23: qty 1

## 2023-07-23 MED ORDER — DIPHENHYDRAMINE HCL 25 MG PO CAPS: 25.0000 mg | ORAL_CAPSULE | Freq: Four times a day (QID) | ORAL | Status: DC | PRN

## 2023-07-23 MED ORDER — SENNOSIDES-DOCUSATE SODIUM 8.6-50 MG PO TABS
2.0000 | ORAL_TABLET | ORAL | Status: DC
  Filled 2023-07-23: qty 2

## 2023-07-23 MED ORDER — SODIUM CHLORIDE 0.9% FLUSH: 3.0000 mL | Freq: Two times a day (BID) | INTRAVENOUS | Status: DC

## 2023-07-23 MED ORDER — LACTATED RINGERS IV SOLN: 500.0000 mL | INTRAVENOUS | Status: DC | PRN

## 2023-07-23 MED ORDER — IBUPROFEN 800 MG PO TABS
800.0000 mg | ORAL_TABLET | Freq: Three times a day (TID) | ORAL | Status: DC
Start: 2023-07-23 — End: 2023-07-26
  Administered 2023-07-23 – 2023-07-26 (×9): 800 mg via ORAL
  Filled 2023-07-23 (×9): qty 1

## 2023-07-23 MED ORDER — DIPHENHYDRAMINE HCL 50 MG/ML IJ SOLN: 12.5000 mg | INTRAMUSCULAR | Status: DC | PRN

## 2023-07-23 MED ORDER — MISOPROSTOL 50MCG HALF TABLET: 50.0000 ug | ORAL_TABLET | ORAL | Status: DC | PRN

## 2023-07-23 MED ORDER — SODIUM CHLORIDE 0.9% FLUSH: 3.0000 mL | INTRAVENOUS | Status: DC | PRN

## 2023-07-23 MED ORDER — ACETAMINOPHEN 325 MG PO TABS: 650.0000 mg | ORAL_TABLET | ORAL | Status: DC | PRN

## 2023-07-23 MED ORDER — MEASLES, MUMPS & RUBELLA VAC IJ SOLR: 0.5000 mL | Freq: Once | INTRAMUSCULAR | Status: DC

## 2023-07-23 MED ORDER — COCONUT OIL OIL: 1.0000 | TOPICAL_OIL | Status: DC | PRN

## 2023-07-23 MED ORDER — SIMETHICONE 80 MG PO CHEW: 80.0000 mg | CHEWABLE_TABLET | ORAL | Status: DC | PRN

## 2023-07-23 MED ORDER — OXYCODONE-ACETAMINOPHEN 5-325 MG PO TABS: 2.0000 | ORAL_TABLET | ORAL | Status: DC | PRN

## 2023-07-23 MED ORDER — OXYCODONE-ACETAMINOPHEN 5-325 MG PO TABS: 1.0000 | ORAL_TABLET | ORAL | Status: DC | PRN

## 2023-07-23 MED ORDER — TETANUS-DIPHTH-ACELL PERTUSSIS 5-2.5-18.5 LF-MCG/0.5 IM SUSY: 0.5000 mL | PREFILLED_SYRINGE | Freq: Once | INTRAMUSCULAR | Status: DC

## 2023-07-23 MED ORDER — BENZOCAINE-MENTHOL 20-0.5 % EX AERO: 1.0000 | INHALATION_SPRAY | CUTANEOUS | Status: DC | PRN

## 2023-07-23 MED ORDER — LACTATED RINGERS IV SOLN
500.0000 mL | Freq: Once | INTRAVENOUS | Status: AC
  Administered 2023-07-23: 500 mL via INTRAVENOUS

## 2023-07-23 MED ORDER — ETONOGESTREL 68 MG ~~LOC~~ IMPL: 68.0000 mg | DRUG_IMPLANT | Freq: Once | SUBCUTANEOUS | Status: DC

## 2023-07-23 MED ORDER — FENTANYL-BUPIVACAINE-NACL 0.5-0.125-0.9 MG/250ML-% EP SOLN
EPIDURAL | Status: AC
Start: 2023-07-23 — End: 2023-07-24
  Filled 2023-07-23: qty 250

## 2023-07-23 MED ORDER — FENTANYL CITRATE (PF) 100 MCG/2ML IJ SOLN: 50.0000 ug | INTRAMUSCULAR | Status: DC | PRN

## 2023-07-23 MED ORDER — LACTATED RINGERS IV SOLN: INTRAVENOUS | Status: DC

## 2023-07-23 MED ORDER — ONDANSETRON HCL 4 MG/2ML IJ SOLN
4.0000 mg | INTRAMUSCULAR | Status: DC | PRN
Start: 2023-07-23 — End: 2023-07-26

## 2023-07-23 MED ORDER — WITCH HAZEL-GLYCERIN EX PADS: 1.0000 | MEDICATED_PAD | CUTANEOUS | Status: DC | PRN

## 2023-07-23 MED ORDER — ONDANSETRON HCL 4 MG PO TABS: 4.0000 mg | ORAL_TABLET | ORAL | Status: DC | PRN

## 2023-07-23 MED ORDER — HYDROXYZINE HCL 50 MG PO TABS: 50.0000 mg | ORAL_TABLET | Freq: Four times a day (QID) | ORAL | Status: DC | PRN

## 2023-07-23 NOTE — Discharge Summary (Signed)
 Postpartum Discharge Summary  Date of Service updated***     Patient Name: Amanda Sloan DOB: [birth year] MRN: [id number]  Date of admission: 07/23/2023 Delivery date:07/23/2023 Delivering provider: Brennan Camara A Date of discharge: 07/23/2023  Admitting diagnosis: PROM (premature rupture of membranes) [O42.90] Intrauterine pregnancy: [redacted]w[redacted]d     Secondary diagnosis:  Principal Problem:   SVD (spontaneous vaginal delivery) Active Problems:   PROM (premature rupture of membranes)  Additional problems: ***    Discharge diagnosis: Term Pregnancy Delivered                                              Post partum procedures:{Postpartum procedures:23558} Augmentation: Cytotec and IP Foley Complications: {OB Labor/Delivery Complications:20784}  Hospital course: Onset of Labor With Vaginal Delivery      28 y.o. yo U2V2536 at [redacted]w[redacted]d was admitted for SROM on 07/23/2023. Labor course was complicated by prolonged SROM  Membrane Rupture Time/Date: 1:00 AM,07/22/2023  Delivery Method:Vaginal, Spontaneous Operative Delivery:N/A Episiotomy: None Lacerations:  None Patient had a postpartum course complicated by ***.  She is ambulating, tolerating a regular diet, passing flatus, and urinating well. Patient is discharged home in stable condition on 07/23/23.  Newborn Data: Birth date:07/23/2023 Birth time:4:22 PM Gender:Female Living status:Living Apgars:1 ,6  Weight:2650 g  Magnesium Sulfate received: {Mag received:30440022} BMZ received: No Rhophylac :{Rhophylac  received:30440032} MMR: ***ordered T-DaP: ***ordered Flu: No RSV Vaccine received: No Transfusion:{Transfusion received:30440034}  Immunizations received: Immunization History  Administered Date(s) Administered   Tdap 02/16/2023    Physical exam  Vitals:   07/23/23 1051 07/23/23 1053 07/23/23 1329 07/23/23 1754  BP: (!) 92/44 102/63 (!) 101/50 (!) 95/56  Pulse: 84 79 92 (!) 109  Resp:    20  Temp:    99.1 F (37.3  C)  TempSrc:    Oral  SpO2:    98%  Weight:      Height:       General: {Exam; general:21111117} Lochia: {Desc; appropriate/inappropriate:30686::"appropriate"} Uterine Fundus: {Desc; firm/soft:30687} Incision: {Exam; incision:21111123} DVT Evaluation: {Exam; dvt:2111122} Labs: Lab Results  Component Value Date   WBC 7.8 07/23/2023   HGB 9.9 (L) 07/23/2023   HCT 31.0 (L) 07/23/2023   MCV 91.4 07/23/2023   PLT 189 07/23/2023      Latest Ref Rng & Units 07/22/2023    1:17 PM  CMP  Glucose 70 - 99 mg/dL 89   BUN 6 - 20 mg/dL <5   Creatinine 6.44 - 1.00 mg/dL 0.34   Sodium 742 - 595 mmol/L 137   Potassium 3.5 - 5.1 mmol/L 3.9   Chloride 98 - 111 mmol/L 104   CO2 22 - 32 mmol/L 18   Calcium 8.9 - 10.3 mg/dL 9.3   Total Protein 6.5 - 8.1 g/dL 6.6   Total Bilirubin 0.0 - 1.2 mg/dL 0.3   Alkaline Phos 38 - 126 U/L 129   AST 15 - 41 U/L 21   ALT 0 - 44 U/L 10    Edinburgh Score:    07/23/2023    5:54 PM  Edinburgh Postnatal Depression Scale Screening Tool  I have been able to laugh and see the funny side of things. --   No data recorded  After visit meds:  Allergies as of 07/23/2023       Reactions   Pork-derived Products    For religious reasons   Shellfish Allergy  For religious reasons     Med Rec must be completed prior to using this [facility]***        Discharge home in stable condition Infant Feeding: Breast Infant Disposition:home with mother Discharge instruction: per After Visit Summary and Postpartum booklet. Activity: Advance as tolerated. Pelvic rest for 6 weeks.  Diet: {OB WJXB:14782956} Future Appointments: Future Appointments  Date Time Provider Department Center  07/26/2023  9:35 AM Leftwich-Kirby, Darren Em, CNM [facility] None   Follow up Visit: Message sent to [facility] 6/7  Please schedule this patient for a In person postpartum visit in 6 weeks with the following provider: Any provider. Additional Postpartum F/U:Postpartum Depression  checkup  Low risk pregnancy complicated by: RNI, trichomononiasis, PTSD Delivery mode:  Vaginal, Spontaneous Anticipated Birth Control:  POPs   07/23/2023 Melanie Spires, MD

## 2023-07-23 NOTE — H&P (Addendum)
 Patient ID: Amanda Sloan, female   DOB: 1995-07-19, 28 y.o.   MRN: [id number]  Subjective: -Care assumed of 28 y.o. G7P6006 at [redacted]w[redacted]d who presents for PROM. She was present earlier today, but left AMA after no progression noted. She returns this morning citing >24 hours since ROM. In room to meet acquaintance of patient and family.  Patient reports occasional contractions and rectal pressure.  She reports fetal movement and denies vaginal bleeding.   Objective: BP 121/78   Pulse 93   Temp 97.7 F (36.5 C) (Oral)   Resp 16   Ht 5\' 7"  (1.702 m)   Wt 68.9 kg   LMP 11/05/2022 Comment: has appt january 13  BMI 23.79 kg/m  No intake/output data recorded. No intake/output data recorded.  Fetal Monitoring: FHT: 145 bpm, Mod Var, -Decels, +Accels UC: Irregular    Physical Exam: General appearance: alert, well appearing, and in no distress. Chest: not examined.  No respiratory distress noted. Abdominal exam: Soft, NT, Gravid. Extremities: No apparent edema Skin exam: Warm Dry  Vaginal Exam: SVE:     Membranes:SROM x 27 hrs Internal Monitors: None  Augmentation/Induction: Pitocin :None Cytotec: None  Assessment:  IUP at 37.1 weeks Cat I FT  GBS Unknown Prolonged ROM  Plan: -Discussed augmentation methods including cervical ripening agents, foley bulbs, and pitocin  -Patient informed that not able to immerse in water while utilizing these methods. -Briefly discussed birth control and patient desires Nexplanon to be placed during hospital stay, but not while partner present. -Patient shows provider email and WB certificate.  Encouraged to upload to mychart if possible.  -Further informed that consent will not yet be signed until candidacy is apparent. -Patient agreeable with plan. -Plan to start with foley bulb placement. -NST reactive.  -GBS culture collected earlier and still in process.  Anticipated completion time of 1300. -Patient reports h/o GBS in 1 of 6 pregnancies.  Will leave untreated for now.  -L&D team updated on patient status. -See initial H&P (6/6 at 1255) for labs and Prenatal and Medical history.   Amanda Heath Yaresly Menzel,MSN, CNM 07/23/2023, 4:41 AM

## 2023-07-23 NOTE — Progress Notes (Signed)
   07/23/23 1641  Spiritual Encounters  Type of Visit Initial;Declined chaplain visit  Care provided to: Pt not available  Conversation partners present during encounter Nurse  Referral source Code page  Reason for visit Code  OnCall Visit Yes   Chaplain responded to a code blue. Patient stabilized quickly. No needs at this time. Spiritual care services available as needed.   Marion Sicilian, Chaplain 07/23/23

## 2023-07-23 NOTE — Progress Notes (Signed)
 Bethannie A Stangl is a 28 y.o. [id number] at [redacted]w[redacted]d admitted for PROM without onset of labor.   Subjective: Pt feeling more painful contractions, desires to rest in bed for now.  S/O not in room currently, plans to return.   Objective: BP 102/63 (BP Location: Left Arm)   Pulse 79   Temp (!) 97.4 F (36.3 C) (Oral)   Resp 18   Ht 5\' 7"  (1.702 m)   Wt 68.9 kg   LMP 11/05/2022 Comment: has appt january 13  BMI 23.79 kg/m  No intake/output data recorded. No intake/output data recorded.  FHT:  FHR: 135 bpm, variability: moderate,  accelerations:  Present,  decelerations:  Absent UC:   irregular, every 5-15 minutes SVE:   Dilation: 1.5 Effacement (%): Thick Station: -3 Exam by:: Nolon Baxter, MD  Labs: Lab Results  Component Value Date   WBC 7.8 07/23/2023   HGB 9.9 (L) 07/23/2023   HCT 31.0 (L) 07/23/2023   MCV 91.4 07/23/2023   PLT 189 07/23/2023    Assessment / Plan: Augmentation of labor, progressing well   Labor: Foley balloon remains in, not well applied to cervix so tension placed and retaped to leg by RN. Preeclampsia:  n/a Fetal Wellbeing:  Category I Pain Control:  Labor support without medications I/D:  GBS unknown Anticipated MOD:  NSVD  Arlester Bence, CNM 07/23/2023, 11:44 AM

## 2023-07-23 NOTE — Progress Notes (Signed)
 LABOR PROGRESS NOTE  Patient Name: Amanda Sloan, female   DOB: 1996-01-28, 28 y.o.  MRN: [id number]  To bedside to meet patient. Amenable to FB placement at this time. Discussed R/B/A of FB placement, and verbal consent obtained. Placed Cook without difficulty and balloon filled with 60 cc of saline. Mom and babe tolerated well. Cat I. Would like to have FB in place for ~1 hour then reconsider oral cytotec. Does not want pitocin  at this time.  Maud Sorenson, MD

## 2023-07-23 NOTE — Progress Notes (Signed)
 Amanda Sloan is a 28 y.o. [phone] at 104w1d admitted for PROM  Subjective: Pt using water for labor in shower, then immersion in tub.  S/O and sister in room for support.   Objective: BP 111/71   Pulse 96   Temp 98.3 F (36.8 C) (Oral)   Resp 18   Ht 5\' 7"  (1.702 m)   Wt 68.9 kg   LMP 11/05/2022 Comment: has appt january 13  SpO2 96%   Breastfeeding Unknown   BMI 23.79 kg/m  I/O last 3 completed shifts: In: -  Out: 146 [Blood:146] No intake/output data recorded.  FHT:  FHR: 150 bpm, variability: moderate,  accelerations:  Present,  decelerations:  Present variables UC:   regular, every 2 minutes SVE:   Dilation: 6 Effacement (%): 80 Station: -2 Exam by:: [employer] CNM  Labs: Lab Results  Component Value Date   WBC 7.8 07/23/2023   HGB 9.9 (L) 07/23/2023   HCT 31.0 (L) 07/23/2023   MCV 91.4 07/23/2023   PLT 189 07/23/2023    Assessment / Plan: H4V4259  at [redacted]w[redacted]d admitted for PROM on 07/22/23 GBS unknown, swab pending  Labor: Variable decelerations until foley balloon removed, then completely resolved.  Pt labored in shower with continuous monitoring, then intermittently monitored after immersion in tub. Pt with urge to push and involuntary pushing in tub.   Preeclampsia:  n/a Fetal Wellbeing:  Category I Pain Control:  Water tub I/D:  GBS unknown Anticipated MOD:  NSVD  Arlester Bence, CNM 07/23/2023, 8:27 PM

## 2023-07-24 MED ORDER — RHO D IMMUNE GLOBULIN 1500 UNIT/2ML IJ SOSY
300.0000 ug | PREFILLED_SYRINGE | Freq: Once | INTRAMUSCULAR | Status: AC
  Administered 2023-07-24: 300 ug via INTRAMUSCULAR
  Filled 2023-07-24: qty 2

## 2023-07-24 MED ORDER — MEDROXYPROGESTERONE ACETATE 150 MG/ML IM SUSP
150.0000 mg | Freq: Once | INTRAMUSCULAR | Status: AC
  Administered 2023-07-24: 150 mg via INTRAMUSCULAR
  Filled 2023-07-24: qty 1

## 2023-07-24 NOTE — Progress Notes (Addendum)
 CSW received update from [facility] CPS social worker, Devora Folks regarding CPS disposition plan. Per Environmental consultant White, barriers to infant's discharge. CPS to meet with family tomorrow, 07/25/23. CPS has requested that the family not receive updates about CPS disposition plan due to safety concerns/concern family would try to leave AMA. Per social worker Pass, CPS is to be notified immediately if family tries to leave AMA. Care team updated.  Signed,  Elizabeth Gulling, MSW, LCSWA, LCASA 07/24/2023 1:16 PM

## 2023-07-24 NOTE — Progress Notes (Signed)
 POSTPARTUM PROGRESS NOTE  Subjective: Amanda Sloan is a 28 y.o. E4V4098 s/p SVD at [redacted]w[redacted]d.  She reports she doing well. No acute events overnight. No issues ambulating or voiding. She has passed flatus. Pain is well controlled.  Lochia is about a period.  Objective: Vitals:   07/24/23 0008 07/24/23 0343  BP: 114/68 99/69  Pulse: 88 83  Resp: 18 18  Temp: 98.6 F (37 C) 98.6 F (37 C)  SpO2: 99% 98%    Physical Exam:  General: alert, cooperative and no distress Chest: no respiratory distress Abdomen: soft, non-tender  Uterine Fundus: firm and below level of umbilicus Extremities: BLE without edema, erythema, or tenderness. Normal DP pulses bilaterally.   Recent Labs    07/22/23 1317 07/23/23 0433  HGB 10.4* 9.9*  HCT 33.1* 31.0*    Assessment/Plan: Amanda Sloan is a 28 y.o. [id number] s/p SVD at [redacted]w[redacted]d.  Routine Postpartum Care: Doing well, pain well-controlled.  -- Continue routine care, lactation support  -- Contraception: Depo (bridge until outpatient Nexplanon) -- Feeding: Breast  Dispo: Plan for discharge 6/9 or 6/10 pending baby dispo.   Carey Chapman, MD 07/24/2023 7:15 AM

## 2023-07-24 NOTE — Lactation Note (Signed)
 This note was copied from a baby's chart. Lactation Consultation Note  Patient Name: Amanda Sloan Today's Date: 07/24/2023 Age:30 hours  P7, [redacted]w[redacted]d. Per RN mother declines lactation assistance.     Luellen Sages  RN, IBCLC 07/24/2023, 8:47 AM
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
## 2023-07-24 NOTE — Progress Notes (Signed)
 Post Partum Day 1  Subjective: Doing well. No acute events overnight. Pain is controlled and bleeding is appropriate. She is eating, drinking, voiding, and ambulating without issue. She is breast feeding which is going well. She has no other concerns at this time.  Objective: Blood pressure 99/62, pulse 88, temperature 98 F (36.7 C), temperature source Oral, resp. rate 17, height 5\' 7"  (1.702 m), weight 68.9 kg, last menstrual period 11/05/2022, SpO2 99%, unknown if currently breastfeeding.  Physical Exam:  General: alert, cooperative, and no distress Resp: normal respiratory effort Lochia: appropriate Uterine Fundus: firm, below umbilicus Incision: NA DVT Evaluation: No evidence of DVT seen on physical exam.  Recent Labs    07/22/23 1317 07/23/23 0433  HGB 10.4* 9.9*  HCT 33.1* 31.0*    Assessment/Plan: Amanda Sloan is a 28 y.o. G7P7 on PPD# 1 s/p NSVD.  Progressing well. Meeting postpartum milestones. VSS. Continue routine postpartum care.  Feeding: breastfeeding Contraception: Depot  Dispo: Continue routine postpartum care, possible discharge home pending baby status   LOS: 1 day   Isabella Roemmich M Latori Beggs, DO  07/24/2023, 11:11 AM

## 2023-07-25 ENCOUNTER — Encounter: Payer: Self-pay | Admitting: Obstetrics and Gynecology

## 2023-07-25 DIAGNOSIS — O99820 Streptococcus B carrier state complicating pregnancy: Secondary | ICD-10-CM | POA: Insufficient documentation

## 2023-07-25 LAB — RH IG WORKUP (INCLUDES ABO/RH)
Fetal Screen: NEGATIVE
Gestational Age(Wks): 37.1
Unit division: 0

## 2023-07-25 NOTE — Progress Notes (Signed)
 Post Partum Day 2 Subjective: no complaints, up ad lib, voiding, and tolerating PO  Objective: Blood pressure 116/67, pulse 87, temperature 97.7 F (36.5 C), temperature source Oral, resp. rate 17, height 5\' 7"  (1.702 m), weight 68.9 kg, last menstrual period 11/05/2022, SpO2 100%, unknown if currently breastfeeding.  Physical Exam:  General: alert, cooperative, and no distress Lochia: appropriate Uterine Fundus: firm Incision: N/A  DVT Evaluation: No evidence of DVT seen on physical exam. No significant calf/ankle edema.  Recent Labs    07/23/23 0433  HGB 9.9*  HCT 31.0*    Assessment/Plan: Plan for discharge tomorrow, Breastfeeding, and Social Work consult   LOS: 2 days   Corie Diamond, CNM 07/25/2023, 3:55 PM

## 2023-07-25 NOTE — Progress Notes (Addendum)
 CSW has spoken with CPS social worker Doc Freed regarding a safe discharge for the infant. Per CPS social worker they will complete a CFT tomorrow 07-26-2023 with [name] and FOB separately. Per the CPS social worker she asked that we delay the discharge for the infant until tomorrow in hopes of creating a safe discharge for the infant once they complete the CFT meeting.   CSW has updated the support staff.  CSW will continue to update the staff once more information is provided by CPS.   Barriers to the infant's discharge at this time.  Jenney Modest, [facility] Clinical Social Worker [phone]

## 2023-07-26 ENCOUNTER — Encounter: Admitting: Advanced Practice Midwife

## 2023-07-26 MED ORDER — IBUPROFEN 800 MG PO TABS
800.0000 mg | ORAL_TABLET | Freq: Three times a day (TID) | ORAL | 0 refills | Status: AC
Start: 2023-07-26 — End: ?

## 2023-07-26 NOTE — Social Work (Signed)
 CSW received a call from [facility] CPS social worker [facility] Alexia Angelucci, [phone]) indicating there would be a CFT today, but they did not have details when it would take place. CPS social worker requested information and asked if the infant was ready to discharge. CSW provided information and followed up with RN. RN informed CSW that the infant is ready for discharge and that [name] had been discharged. CSW informed CPS social worker that [name] was discharged, and infant would be discharged today. CSW asked CPS social worker if [name] was made aware of the CFT meeting. CPS social worker stated that [name] was aware of CFT meeting. CPS social worker agreed to provide updates regarding CFT and infant's discharge when updates are available.   Nickolas Barr, MSW, LCSW Clinical Social Worker  [phone] 07/26/2023  10:00 AM

## 2023-07-26 NOTE — Progress Notes (Addendum)
 CSW attended a CFT meeting with [place] CPS, [name] and her attorney. According to CPS they are taking custody of the infant and have placed the 12 hour hold on the infant.  CSW will obtain the non secure custody agreement, and place in the chart once they are provided.   CSW and RN have placed the infant in the nursery and [name] has been discharged from the hospital.    With the 12 hour hold on the infant; [name] is to reachout to the CPS social worker about any further interactions with the infant. As of now the infant is to have NO visitors unless they are foster parents/CPS social workers.  The infant is to remain in the nursery until a [facility]von[facility] Heritage family is identified and the documentation is completed for placement.  CSW will continue to update the support staff once more information is provided.   CPS [facility] social worker Doc Freed [phone]  Barriers to infant's discharge at this time.  Jenney Modest, [facility] Clinical Social Worker [phone]

## 2023-08-02 ENCOUNTER — Ambulatory Visit: Admitting: Licensed Clinical Social Worker

## 2023-08-02 DIAGNOSIS — R69 Illness, unspecified: Secondary | ICD-10-CM

## 2023-08-02 NOTE — BH Specialist Note (Unsigned)
 Integrated Behavioral Health via Telemedicine Visit  08/05/2023 Amanda Sloan [id number]   Patient and/or Family Response: The patient was present for today's scheduled session. She expressed uncertainty regarding the purpose of the appointment and reported that she is already actively engaged in weekly therapy with a provider at [facility]. The patient stated that she has a follow-up appointment with her primary therapist scheduled for tomorrow and did not identify a need for additional behavioral health services at this time. No further intervention was provided during this session.   Clinical Assessment/Diagnosis  Diagnosis deferred    Assessment:  Patient may benefit from continued ongoing services with [facility].  Plan: Follow up with behavioral health clinician on : No follow up scheduled.  Behavioral recommendations: Patient to follow at [facility] for OPT Referral(s): Community Mental Health Services (LME/Outside Clinic)  I discussed the assessment and treatment plan with the patient and/or parent/guardian. They were provided an opportunity to ask questions and all were answered. They agreed with the plan and demonstrated an understanding of the instructions.   They were advised to call back or seek an in-person evaluation if the symptoms worsen or if the condition fails to improve as anticipated.  Amanda Sloan Iles, LCSWA

## 2023-08-04 ENCOUNTER — Telehealth (HOSPITAL_COMMUNITY): Payer: Self-pay | Admitting: *Deleted

## 2023-08-04 NOTE — Telephone Encounter (Signed)
 08/04/2023  Name: Amanda Sloan MRN: [id number] DOB: Feb 23, 1995  Reason for Call:  Transition of Care Hospital Discharge Call  Contact Status: Patient Contact Status: Complete  Language assistant needed: Interpreter Mode: Interpreter Not Needed        Follow-Up Questions: Do You Have Any Concerns About Your Health As You Heal From Delivery?: No Do You Have Any Concerns About Your Infants Health?: No  Edinburgh Postnatal Depression Scale:  In the Past 7 Days:    PHQ2-9 Depression Scale:     Discharge Follow-up: Edinburgh score requires follow up?:  (doing as well as I can given the circumstances, doing fine)  Post-discharge interventions: NA  Pearlie Bougie, RN 08/04/2023 10:13

## 2023-08-29 ENCOUNTER — Ambulatory Visit: Admitting: Family Medicine

## 2023-08-30 ENCOUNTER — Ambulatory Visit: Admitting: Advanced Practice Midwife

## 2023-09-13 ENCOUNTER — Ambulatory Visit: Admitting: Advanced Practice Midwife

## 2023-09-26 ENCOUNTER — Other Ambulatory Visit (HOSPITAL_COMMUNITY)
Admission: RE | Admit: 2023-09-26 | Discharge: 2023-09-26 | Disposition: A | Discharge status: Home / Self Care | Source: Ambulatory Visit | Attending: Obstetrics and Gynecology | Admitting: Obstetrics and Gynecology

## 2023-09-26 ENCOUNTER — Ambulatory Visit: Admitting: Obstetrics and Gynecology

## 2023-09-26 ENCOUNTER — Encounter: Payer: Self-pay | Admitting: Obstetrics and Gynecology

## 2023-09-26 DIAGNOSIS — Z113 Encounter for screening for infections with a predominantly sexual mode of transmission: Secondary | ICD-10-CM | POA: Diagnosis present

## 2023-09-26 DIAGNOSIS — F32A Depression, unspecified: Secondary | ICD-10-CM | POA: Diagnosis not present

## 2023-09-26 DIAGNOSIS — Z3202 Encounter for pregnancy test, result negative: Secondary | ICD-10-CM | POA: Diagnosis not present

## 2023-09-26 DIAGNOSIS — Z30011 Encounter for initial prescription of contraceptive pills: Secondary | ICD-10-CM

## 2023-09-26 LAB — POCT URINE PREGNANCY: Preg Test, Ur: NEGATIVE

## 2023-09-26 MED ORDER — NORETHIN ACE-ETH ESTRAD-FE 1-20 MG-MCG PO TABS: 1.0000 | ORAL_TABLET | Freq: Every day | ORAL | 3 refills | Status: AC

## 2023-09-26 MED ORDER — SERTRALINE HCL 25 MG PO TABS: 25.0000 mg | ORAL_TABLET | Freq: Every day | ORAL | 2 refills | Status: AC

## 2023-09-26 NOTE — Progress Notes (Signed)
 C/O cough and body aches and chills. Throat sore. Took Sudafed yesterday. Edinburgh 12 Does see a therapist. Received Depo in hospital.    Post Partum Visit Note  Amanda Sloan is a 28 y.o. [phone] female who presents for a postpartum visit. She is 9 week postpartum following a normal spontaneous vaginal delivery.  I have fully reviewed the prenatal and intrapartum course. The delivery was at 37.1 gestational weeks.  Anesthesia: none. Postpartum course has been ok. Baby is doing well. Doing well. Baby is feeding by bottle - Similac Sensitive RS. Bleeding no bleeding. Bowel function is normal. Bladder function is normal. Patient is sexually active. Contraception method is Depo-Provera  injections. Postpartum depression screening: positive.   The pregnancy intention screening data noted above was reviewed. Potential methods of contraception were discussed. The patient elected to proceed with OCPs.   Edinburgh Postnatal Depression Scale - 09/26/23 1350       Edinburgh Postnatal Depression Scale:  In the Past 7 Days   I have been able to laugh and see the funny side of things. 0    I have looked forward with enjoyment to things. 0    I have blamed myself unnecessarily when things went wrong. 1    I have been anxious or worried for no good reason. 3    I have felt scared or panicky for no good reason. 3    Things have been getting on top of me. 0    I have been so unhappy that I have had difficulty sleeping. 2    I have felt sad or miserable. 2    I have been so unhappy that I have been crying. 1    The thought of harming myself has occurred to me. 0    Edinburgh Postnatal Depression Scale Total 12          Health Maintenance Due  Topic Date Due   Pneumococcal Vaccine: 19-49 Years (1 of 2 - PCV) Never done   Hepatitis B Vaccines (1 of 3 - 19+ 3-dose series) Never done   HPV VACCINES (1 - 3-dose SCDM series) Never done   COVID-19 Vaccine (1 - 2024-25 season) Never done   INFLUENZA  VACCINE  09/16/2023    The following portions of the patient's history were reviewed and updated as appropriate: allergies, current medications, past family history, past medical history, past social history, past surgical history, and problem list.  Review of Systems Pertinent items are noted in HPI.  Objective:  LMP 11/05/2022 Comment: has appt january 13   General:  alert and cooperative   Breasts:  not indicated  Lungs: Normal rate  Heart:  Normal rate  Abdomen: Not indicated   GU exam:  not indicated       Assessment:   1. Postpartum care and examination (Primary) -recovering well. Received Depo in the hospital but doesn't want to come in every 3 months. Desires to start pills for now & will change back to Depo if she cannot remember to take them - norethindrone-ethinyl estradiol-FE (LOESTRIN FE) 1-20 MG-MCG tablet; Take 1 tablet by mouth daily.  Dispense: 90 tablet; Refill: 3  2. Screening for STD (sexually transmitted disease) Desires testing   3. Depression, unspecified depression type -Hx of depression, sees therapist weekly. Edinburgh 12 today. Will follow up on Zoloft  at Colposcopy appt.  - sertraline  (ZOLOFT ) 25 MG tablet; Take 1 tablet (25 mg total) by mouth daily.  Dispense: 30 tablet; Refill: 2    Plan:  Essential components of care per ACOG recommendations:  1.  Mood and well being: Patient with positive depression screening today. Reviewed local resources for support.  - Patient tobacco use? Yes. Patient desires to quit? Yes.Discussed reduction and cessation  - hx of drug use? No.    2. Infant care and feeding:  -Patient currently breastmilk feeding? No.  -Social determinants of health (SDOH) reviewed in EPIC. No concerns.  3. Sexuality, contraception and birth spacing - Patient does not want a pregnancy in the next year.  Desired family size is 7 children.  - Reviewed reproductive life planning. Reviewed contraceptive methods based on pt preferences  and effectiveness.  Patient desired Oral Contraceptive today.   - Discussed birth spacing of 18 months  4. Sleep and fatigue -Encouraged family/partner/community support of 4 hrs of uninterrupted sleep to help with mood and fatigue  5. Physical Recovery  - Discussed patients delivery and complications. She describes her labor as good. - Patient had a Vaginal, no problems at delivery. Patient had no laceration. Perineal healing reviewed. Patient expressed understanding - Patient has urinary incontinence? No. - Patient is safe to resume physical and sexual activity  6.  Health Maintenance - HM due items addressed: yes - Last pap smear  Diagnosis  Date Value Ref Range Status  05/31/2023 (A)  Final   - Atypical squamous cells, cannot exclude high grade squamous  05/31/2023 intraepithelial lesion (ASC-H) (A)  Final   Pap smear not done at today's visit. Patient will return for colposcopy -Breast Cancer screening indicated? No.    7. Chronic Disease/Pregnancy Condition follow up: None - PCP follow up   Elenor Mole, [facility] 09/26/23

## 2023-09-27 ENCOUNTER — Ambulatory Visit: Payer: Self-pay | Admitting: Obstetrics and Gynecology

## 2023-09-27 LAB — CERVICOVAGINAL ANCILLARY ONLY
Chlamydia: NEGATIVE
Comment: NEGATIVE
Comment: NEGATIVE
Comment: NORMAL
Neisseria Gonorrhea: NEGATIVE
Trichomonas: NEGATIVE

## 2023-10-13 ENCOUNTER — Encounter: Admitting: Obstetrics and Gynecology

## 2023-11-30 ENCOUNTER — Telehealth: Payer: Self-pay

## 2023-11-30 NOTE — Telephone Encounter (Signed)
 Returned call, pt states that she was able to get prescriptions from pharmacy.
